# Patient Record
Sex: Female | Born: 2000 | Race: Black or African American | Hispanic: No | Marital: Single | Smoking: Never smoker
Health system: Southern US, Community
[De-identification: ages and names within clinical notes are randomized; demographics above are authoritative.]

---
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
## 2018-11-19 ENCOUNTER — Encounter (HOSPITAL_BASED_OUTPATIENT_CLINIC_OR_DEPARTMENT_OTHER): Payer: Self-pay | Admitting: Emergency Medicine

## 2018-11-19 ENCOUNTER — Emergency Department (HOSPITAL_BASED_OUTPATIENT_CLINIC_OR_DEPARTMENT_OTHER): Payer: Medicaid Other

## 2018-11-19 ENCOUNTER — Other Ambulatory Visit: Payer: Self-pay

## 2018-11-19 ENCOUNTER — Emergency Department (HOSPITAL_BASED_OUTPATIENT_CLINIC_OR_DEPARTMENT_OTHER)
Admission: EM | Admit: 2018-11-19 | Discharge: 2018-11-19 | Disposition: A | Discharge status: Home / Self Care | Payer: Medicaid Other | Attending: Emergency Medicine | Admitting: Emergency Medicine

## 2018-11-19 DIAGNOSIS — R9389 Abnormal findings on diagnostic imaging of other specified body structures: Secondary | ICD-10-CM | POA: Insufficient documentation

## 2018-11-19 DIAGNOSIS — N939 Abnormal uterine and vaginal bleeding, unspecified: Secondary | ICD-10-CM | POA: Diagnosis not present

## 2018-11-19 DIAGNOSIS — N912 Amenorrhea, unspecified: Secondary | ICD-10-CM | POA: Diagnosis not present

## 2018-11-19 DIAGNOSIS — R102 Pelvic and perineal pain: Secondary | ICD-10-CM

## 2018-11-19 LAB — CBC WITH DIFFERENTIAL/PLATELET
ABS IMMATURE GRANULOCYTES: 0.01 10*3/uL (ref 0.00–0.07)
Basophils Absolute: 0 10*3/uL (ref 0.0–0.1)
Basophils Relative: 1 %
Eosinophils Absolute: 0.3 10*3/uL (ref 0.0–0.5)
Eosinophils Relative: 5 %
HEMATOCRIT: 39.9 % (ref 36.0–46.0)
HEMOGLOBIN: 11.9 g/dL — AB (ref 12.0–15.0)
Immature Granulocytes: 0 %
LYMPHS ABS: 2.2 10*3/uL (ref 0.7–4.0)
Lymphocytes Relative: 39 %
MCH: 23.8 pg — ABNORMAL LOW (ref 26.0–34.0)
MCHC: 29.8 g/dL — ABNORMAL LOW (ref 30.0–36.0)
MCV: 80 fL (ref 80.0–100.0)
Monocytes Absolute: 0.5 10*3/uL (ref 0.1–1.0)
Monocytes Relative: 10 %
Neutro Abs: 2.6 10*3/uL (ref 1.7–7.7)
Neutrophils Relative %: 45 %
Platelets: 256 10*3/uL (ref 150–400)
RBC: 4.99 MIL/uL (ref 3.87–5.11)
RDW: 14.6 % (ref 11.5–15.5)
WBC: 5.6 10*3/uL (ref 4.0–10.5)
nRBC: 0 % (ref 0.0–0.2)

## 2018-11-19 LAB — URINALYSIS, ROUTINE W REFLEX MICROSCOPIC
Bilirubin Urine: NEGATIVE
Glucose, UA: NEGATIVE mg/dL
Hgb urine dipstick: NEGATIVE
KETONES UR: NEGATIVE mg/dL
Leukocytes,Ua: NEGATIVE
Nitrite: NEGATIVE
Protein, ur: NEGATIVE mg/dL
Specific Gravity, Urine: 1.025 (ref 1.005–1.030)
pH: 7 (ref 5.0–8.0)

## 2018-11-19 LAB — BASIC METABOLIC PANEL
Anion gap: 7 (ref 5–15)
BUN: 14 mg/dL (ref 6–20)
CO2: 24 mmol/L (ref 22–32)
Calcium: 8.8 mg/dL — ABNORMAL LOW (ref 8.9–10.3)
Chloride: 105 mmol/L (ref 98–111)
Creatinine, Ser: 0.89 mg/dL (ref 0.44–1.00)
GFR calc Af Amer: >60 mL/min (ref >60)
GFR calc non Af Amer: >60 mL/min (ref >60)
Glucose, Bld: 95 mg/dL (ref 70–99)
Potassium: 3.5 mmol/L (ref 3.5–5.1)
Sodium: 136 mmol/L (ref 135–145)

## 2018-11-19 LAB — WET PREP, GENITAL
Sperm: NONE SEEN
Trich, Wet Prep: NONE SEEN
Yeast Wet Prep HPF POC: NONE SEEN

## 2018-11-19 LAB — PREGNANCY, URINE: Preg Test, Ur: NEGATIVE

## 2018-11-19 NOTE — ED Triage Notes (Signed)
Vaginal bleeding x 10 days. Concerned about being pregnant. Denies pain.

## 2018-11-19 NOTE — Discharge Instructions (Addendum)
Follow-up with women's outpatient clinic in the next few weeks, sooner if your bleeding worsens, you develop severe abdominal pain, high fever, or other new and concerning symptoms.

## 2018-11-19 NOTE — ED Notes (Signed)
ED Provider at bedside. 

## 2018-11-19 NOTE — ED Notes (Signed)
Patient left at this time with all belongings. 

## 2018-11-19 NOTE — ED Provider Notes (Signed)
[facility] EMERGENCY DEPARTMENT Provider Note   CSN: [id number] Arrival date & time: 11/19/18  1843    History   Chief Complaint Chief Complaint  Patient presents with  . Vaginal Bleeding    HPI Erica Randall is a 18 y.o. female.     Patient is a 18 year old female presenting with complaints of vaginal bleeding.  She states that she has had her menstrual period continuously for the past 10 days.  She does describe some discomfort with intercourse and foul-smelling discharge.  She denies fevers or chills.  She denies any urinary complaints.  The history is provided by the patient.  Vaginal Bleeding  Quality:  Dark red and typical of menses Severity:  Moderate Onset quality:  Sudden Duration:  10 days Timing:  Constant Progression:  Unchanged Chronicity:  New   History reviewed. No pertinent past medical history.  There are no active problems to display for this patient.   History reviewed. No pertinent surgical history.   OB History   No obstetric history on file.      Home Medications    Prior to Admission medications   Not on File    Family History No family history on file.  Social History Social History   Tobacco Use  . Smoking status: Never Smoker  . Smokeless tobacco: Never Used  Substance Use Topics  . Alcohol use: Yes  . Drug use: Yes    Types: Marijuana     Allergies   Patient has no known allergies.   Review of Systems Review of Systems  Genitourinary: Positive for vaginal bleeding.  All other systems reviewed and are negative.    Physical Exam Updated Vital Signs BP 133/75 (BP Location: Left Arm)   Pulse (!) 114   Temp 98.2 F (36.8 C) (Oral)   Resp 18   Ht 5\' 9"  (1.753 m)   Wt 116.6 kg   LMP 08/05/2018 (Approximate)   SpO2 100%   BMI 37.95 kg/m   Physical Exam Vitals signs and nursing note reviewed.  Constitutional:      General: She is not in acute distress.    Appearance: She is well-developed. She  is not diaphoretic.  HENT:     Head: Normocephalic and atraumatic.  Neck:     Musculoskeletal: Normal range of motion and neck supple.  Cardiovascular:     Rate and Rhythm: Normal rate and regular rhythm.     Heart sounds: No murmur. No friction rub. No gallop.   Pulmonary:     Effort: Pulmonary effort is normal. No respiratory distress.     Breath sounds: Normal breath sounds. No wheezing.  Abdominal:     General: Bowel sounds are normal. There is no distension.     Palpations: Abdomen is soft.     Tenderness: There is no abdominal tenderness.  Genitourinary:    General: Normal vulva.     Vagina: Vaginal discharge present.     Comments: The external genitalia appears normal.  There is no active bleeding, just some dark red blood in the vaginal vault.  There is no adnexal mass or tenderness.  There is no cervical motion tenderness. Musculoskeletal: Normal range of motion.  Skin:    General: Skin is warm and dry.  Neurological:     Mental Status: She is alert and oriented to person, place, and time.      ED Treatments / Results  Labs (all labs ordered are listed, but only abnormal results are displayed) Labs Reviewed  WET PREP, GENITAL  PREGNANCY, URINE  BASIC METABOLIC PANEL  CBC WITH DIFFERENTIAL/PLATELET  GC/CHLAMYDIA PROBE AMP ([place]) NOT AT [facility]    EKG None  Radiology No results found.  Procedures Procedures (including critical care time)  Medications Ordered in ED Medications - No data to display   Initial Impression / Assessment and Plan / ED Course  I have reviewed the triage vital signs and the nursing notes.  Pertinent labs & imaging results that were available during my care of the patient were reviewed by me and considered in my medical decision making (see chart for details).  Patient is a 18 year old female presenting with complaints of vaginal bleeding.  She is also concerned she may be pregnant.  Her pregnancy test today is negative and  laboratory studies are unremarkable.  Pelvic exam shows no obvious abnormalities.  She does have clue cells, but I doubt BV.  GC and chlamydia tests are pending at this time.  She will be notified if these become abnormal.  Ultrasound was obtained showing a thickened endometrial stripe, the clinical significance of which I am uncertain.  Patient has been advised of this finding and is to follow-up with GYN in the next few weeks.  She is to return as needed for any problems.  Final Clinical Impressions(s) / ED Diagnoses   Final diagnoses:  None    ED Discharge Orders    None       Geoffery Lyons, MD 11/19/18 2235

## 2018-11-19 NOTE — ED Notes (Signed)
Called lab to add-on urinalysis °

## 2018-11-21 LAB — GC/CHLAMYDIA PROBE AMP (~~LOC~~) NOT AT ARMC
Chlamydia: NEGATIVE
Neisseria Gonorrhea: NEGATIVE

## 2018-12-02 ENCOUNTER — Ambulatory Visit (INDEPENDENT_AMBULATORY_CARE_PROVIDER_SITE_OTHER): Payer: Medicaid Other | Admitting: Family Medicine

## 2018-12-02 ENCOUNTER — Encounter: Payer: Self-pay | Admitting: Family Medicine

## 2018-12-02 ENCOUNTER — Other Ambulatory Visit: Payer: Self-pay

## 2018-12-02 VITALS — BP 120/57 | HR 91 | Ht 69.0 in | Wt 255.0 lb

## 2018-12-02 DIAGNOSIS — N939 Abnormal uterine and vaginal bleeding, unspecified: Secondary | ICD-10-CM

## 2018-12-02 DIAGNOSIS — B9689 Other specified bacterial agents as the cause of diseases classified elsewhere: Secondary | ICD-10-CM

## 2018-12-02 DIAGNOSIS — N760 Acute vaginitis: Secondary | ICD-10-CM

## 2018-12-02 MED ORDER — METRONIDAZOLE 500 MG PO TABS: 500.0000 mg | ORAL_TABLET | Freq: Two times a day (BID) | ORAL | 0 refills | Status: DC

## 2018-12-02 MED ORDER — NORGESTIMATE-ETH ESTRADIOL 0.25-35 MG-MCG PO TABS: 1.0000 | ORAL_TABLET | Freq: Every day | ORAL | 11 refills | Status: DC

## 2018-12-02 MED ORDER — NORGESTIMATE-ETH ESTRADIOL 0.25-35 MG-MCG PO TABS: 1.0000 | ORAL_TABLET | Freq: Every day | ORAL | 3 refills | Status: DC

## 2018-12-02 NOTE — Progress Notes (Signed)
Pt states that she is having abnormal bleeding x 3 weeks.

## 2018-12-02 NOTE — Progress Notes (Signed)
   Subjective:    Patient ID: Erica Randall, female    DOB: [birth year], 18 y.o.   MRN: [id number]  HPI Patient seen for abnormal uterine bleeding for the past 3 weeks.  Patient normally has irregular periods and may go a couple months without having a period.  Her blast bleeding started the end of February and has continued until today.  She was seen in the emergency department and an ultrasound done, which was normal (endometrial thickness 14 mm).  Additionally, she had clue cells on wet prep, but was not prescribed Flagyl.  Has foul vaginal discharge  No history of migraines.  No personal history of blood clots.  Denies tobacco smoking.  I have reviewed the patients past medical, family, and social history.  I have reviewed the patient's medication list and allergies.   Review of Systems     Objective:   Physical Exam Vitals signs reviewed.  Constitutional:      Appearance: Normal appearance.  HENT:     Head: Normocephalic and atraumatic.     Nose: Nose normal.     Mouth/Throat:     Mouth: Mucous membranes are moist.  Eyes:     Pupils: Pupils are equal, round, and reactive to light.  Neck:     Musculoskeletal: Normal range of motion.  Cardiovascular:     Rate and Rhythm: Normal rate and regular rhythm.     Pulses: Normal pulses.     Heart sounds: No murmur. No friction rub. No gallop.   Pulmonary:     Effort: Pulmonary effort is normal. No respiratory distress.     Breath sounds: Normal breath sounds. No stridor. No wheezing, rhonchi or rales.  Chest:     Chest wall: No tenderness.  Abdominal:     General: Abdomen is flat. There is no distension.     Palpations: Abdomen is soft.     Tenderness: There is no abdominal tenderness.  Skin:    General: Skin is warm.     Capillary Refill: Capillary refill takes less than 2 seconds.  Neurological:     General: No focal deficit present.     Mental Status: She is alert.        Assessment & Plan:  1. Abnormal uterine  bleeding (AUB) Was prescribed OCP taper.  Discussed how to use.  Discussed adverse side effects.  Discussed that birth control pills cannot protect against STDs. F/u in 2 months  2. BV (bacterial vaginosis) Flagyl prescribed

## 2018-12-25 MED ORDER — MEGESTROL ACETATE 40 MG PO TABS: 40.0000 mg | ORAL_TABLET | Freq: Every day | ORAL | 5 refills | Status: DC

## 2019-01-23 ENCOUNTER — Other Ambulatory Visit: Payer: Self-pay

## 2019-01-23 ENCOUNTER — Ambulatory Visit (INDEPENDENT_AMBULATORY_CARE_PROVIDER_SITE_OTHER): Payer: Medicaid Other | Admitting: Family Medicine

## 2019-01-23 VITALS — BP 120/65 | HR 90 | Wt 254.0 lb

## 2019-01-23 DIAGNOSIS — Z30013 Encounter for initial prescription of injectable contraceptive: Secondary | ICD-10-CM

## 2019-01-23 DIAGNOSIS — N939 Abnormal uterine and vaginal bleeding, unspecified: Secondary | ICD-10-CM | POA: Diagnosis not present

## 2019-01-23 MED ORDER — MEDROXYPROGESTERONE ACETATE 150 MG/ML IM SUSP: 150.0000 mg | INTRAMUSCULAR | 0 refills | Status: DC

## 2019-01-23 MED ORDER — MEGESTROL ACETATE 40 MG PO TABS: 40.0000 mg | ORAL_TABLET | Freq: Every day | ORAL | 5 refills | Status: DC

## 2019-01-23 MED ORDER — MEDROXYPROGESTERONE ACETATE 150 MG/ML IM SUSP
150.0000 mg | Freq: Once | INTRAMUSCULAR | Status: AC
  Administered 2019-01-23: 150 mg via INTRAMUSCULAR

## 2019-01-23 NOTE — Progress Notes (Signed)
   Subjective:    Patient ID: Erica Randall, female    DOB: Jul 22, 2001, 18 y.o.   MRN: [id number]  HPI Patient seen for follow up of AUB. Is having some breakthrough bleeding during the third week of pill pack. Is currently on 4th week. Had improved control last month with the addition of megace for 10 days.  She would like to switch to depo shots.   Review of Systems     Objective:   Physical Exam Constitutional:      Appearance: Normal appearance.  Cardiovascular:     Rate and Rhythm: Normal rate.     Pulses: Normal pulses.     Heart sounds: Normal heart sounds.  Pulmonary:     Effort: Pulmonary effort is normal.  Neurological:     Mental Status: She is alert.       Assessment & Plan:  1. Abnormal uterine bleeding (AUB) Change to depo. Will send prescription for megace to pharmacy in case she has increased breakthrough bleeding - patient to notify me if starts. Discussed possible weight gain with depo.  2. Encounter for initial prescription of injectable contraceptive  - medroxyPROGESTERone (DEPO-PROVERA) 150 MG/ML injection; Inject 1 mL (150 mg total) into the muscle every 3 (three) months.  Dispense: 1 mL; Refill: 0 - medroxyPROGESTERone (DEPO-PROVERA) injection 150 mg

## 2019-01-23 NOTE — Progress Notes (Signed)
Follow up bleeding and patient would like switch in birth control. Patient did take Megace for ten days when prescribed. Armandina Stammer RN

## 2019-01-28 ENCOUNTER — Other Ambulatory Visit: Payer: Self-pay | Admitting: Family Medicine

## 2019-01-28 DIAGNOSIS — Z30013 Encounter for initial prescription of injectable contraceptive: Secondary | ICD-10-CM

## 2019-01-29 MED ORDER — MEGESTROL ACETATE 40 MG PO TABS: 40.0000 mg | ORAL_TABLET | Freq: Two times a day (BID) | ORAL | 0 refills | Status: DC

## 2019-02-04 MED ORDER — MEGESTROL ACETATE 40 MG PO TABS: 80.0000 mg | ORAL_TABLET | Freq: Two times a day (BID) | ORAL | 2 refills | Status: DC

## 2019-02-09 DIAGNOSIS — N939 Abnormal uterine and vaginal bleeding, unspecified: Secondary | ICD-10-CM

## 2019-02-14 ENCOUNTER — Ambulatory Visit (HOSPITAL_BASED_OUTPATIENT_CLINIC_OR_DEPARTMENT_OTHER)
Admission: RE | Admit: 2019-02-14 | Discharge: 2019-02-14 | Disposition: A | Discharge status: Home / Self Care | Payer: Medicaid Other | Source: Ambulatory Visit | Attending: Family Medicine | Admitting: Family Medicine

## 2019-02-14 ENCOUNTER — Other Ambulatory Visit: Payer: Self-pay

## 2019-02-14 ENCOUNTER — Encounter (HOSPITAL_BASED_OUTPATIENT_CLINIC_OR_DEPARTMENT_OTHER): Payer: Self-pay

## 2019-02-14 DIAGNOSIS — N939 Abnormal uterine and vaginal bleeding, unspecified: Secondary | ICD-10-CM | POA: Diagnosis not present

## 2019-02-19 MED ORDER — CITALOPRAM HYDROBROMIDE 20 MG PO TABS: 20.0000 mg | ORAL_TABLET | Freq: Every day | ORAL | 12 refills | Status: DC

## 2019-02-19 MED ORDER — NORGESTIMATE-ETH ESTRADIOL 0.25-35 MG-MCG PO TABS: 1.0000 | ORAL_TABLET | Freq: Every day | ORAL | 3 refills | Status: DC

## 2019-02-19 NOTE — Telephone Encounter (Signed)
Called patient. Patient quiet emotional on phone, sobbing and crying. Patient having a lot of mood swings, crying and depression. Patient is still having some spotting and occasional days of increased bleeding. Had less bleeding when she was on OCP and had less bleeding.   Discussed need to take OCP every day. She also would like to start Antidepressent - started celexa 20mg . I discussed with her that mood improvement may be seen with OCP, but may be delayed with celexa for a couple of weeks, but that she may see improvement with anxiety component in a 3-5 days.  Patient instructed to call with any worsening of mood or bleeding.  Levie Heritage, DO 02/19/2019 10:05 AM

## 2019-03-13 ENCOUNTER — Other Ambulatory Visit: Payer: Self-pay | Admitting: Family Medicine

## 2019-03-27 ENCOUNTER — Other Ambulatory Visit (HOSPITAL_COMMUNITY)
Admission: RE | Admit: 2019-03-27 | Discharge: 2019-03-27 | Disposition: A | Discharge status: Home / Self Care | Payer: Medicaid Other | Source: Ambulatory Visit | Attending: Family Medicine | Admitting: Family Medicine

## 2019-03-27 ENCOUNTER — Other Ambulatory Visit: Payer: Self-pay

## 2019-03-27 ENCOUNTER — Other Ambulatory Visit (INDEPENDENT_AMBULATORY_CARE_PROVIDER_SITE_OTHER): Payer: Medicaid Other

## 2019-03-27 VITALS — BP 126/94 | HR 102

## 2019-03-27 DIAGNOSIS — N898 Other specified noninflammatory disorders of vagina: Secondary | ICD-10-CM | POA: Diagnosis not present

## 2019-03-27 NOTE — Progress Notes (Signed)
SUBJECTIVE:  18 y.o. female complains of thick vaginal discharge for 1 week(s). Denies abnormal vaginal bleeding or significant pelvic pain or fever. No UTI symptoms. Denies history of known exposure to STD.  No LMP recorded.  OBJECTIVE:  She appears well, afebrile. Urine dipstick: not done.  ASSESSMENT:  Vaginal Discharge     PLAN:  GC, chlamydia, trichomonas, BVAG, CVAG probe sent to lab. Treatment: To be determined once lab results are received ROV prn if symptoms persist or worsen.

## 2019-03-27 NOTE — Progress Notes (Addendum)
Chart reviewed - agree with RN documentation.   

## 2019-03-28 LAB — CERVICOVAGINAL ANCILLARY ONLY
Bacterial vaginitis: NEGATIVE
Candida vaginitis: POSITIVE — AB
Chlamydia: NEGATIVE
Neisseria Gonorrhea: NEGATIVE
Trichomonas: NEGATIVE

## 2019-03-31 ENCOUNTER — Other Ambulatory Visit: Payer: Self-pay | Admitting: Family Medicine

## 2019-03-31 MED ORDER — FLUCONAZOLE 150 MG PO TABS: 150.0000 mg | ORAL_TABLET | Freq: Every day | ORAL | 2 refills | Status: DC

## 2019-04-10 ENCOUNTER — Ambulatory Visit: Payer: Medicaid Other

## 2019-04-15 ENCOUNTER — Other Ambulatory Visit: Payer: Self-pay | Admitting: Family Medicine

## 2019-04-15 DIAGNOSIS — Z30013 Encounter for initial prescription of injectable contraceptive: Secondary | ICD-10-CM

## 2019-05-29 ENCOUNTER — Other Ambulatory Visit: Payer: Self-pay

## 2019-05-29 DIAGNOSIS — Z20822 Contact with and (suspected) exposure to covid-19: Secondary | ICD-10-CM

## 2019-05-30 LAB — NOVEL CORONAVIRUS, NAA: SARS-CoV-2, NAA: NOT DETECTED

## 2019-07-26 IMAGING — US US PELVIS COMPLETE WITH TRANSVAGINAL
1 series · 13 of 25 positions shown · non-contrast
Comparison: None

CLINICAL DATA: Long cycle after 2-3 months without cycle

EXAM:
TRANSABDOMINAL AND TRANSVAGINAL ULTRASOUND OF PELVIS
TECHNIQUE: Both transabdominal and transvaginal ultrasound examinations of the
pelvis were performed. Transabdominal technique was performed for
global imaging of the pelvis including uterus, ovaries, adnexal
regions, and pelvic cul-de-sac. It was necessary to proceed with
endovaginal exam following the transabdominal exam to visualize the
uterus endometrium and ovaries.

[Series 1: us pelvis complete with transvaginal · 0.25mm/px · 13 of 43 slices shown]
[im 1/43]
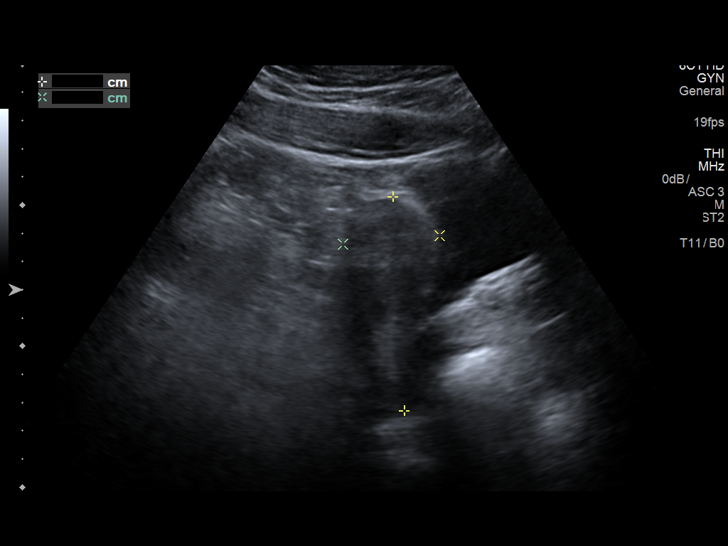
[im 4/43]
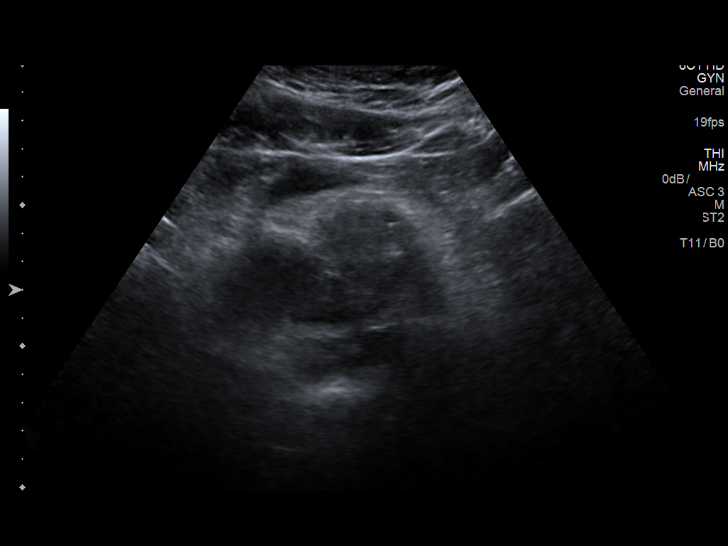
[im 8/43]
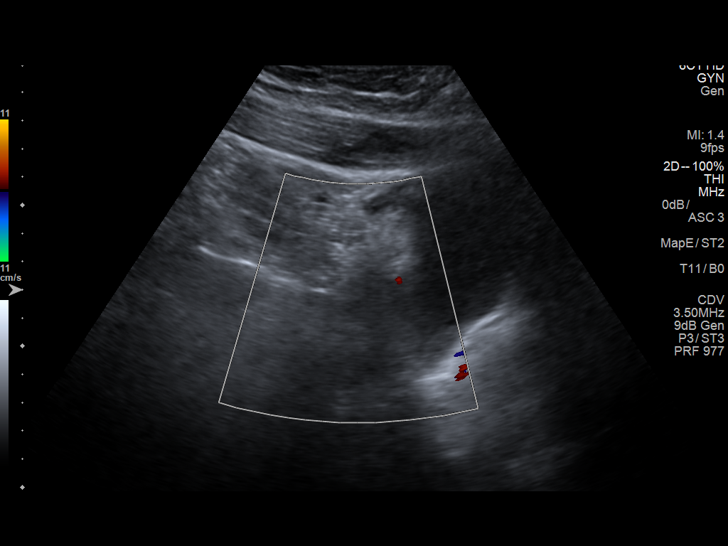
[im 11/43]
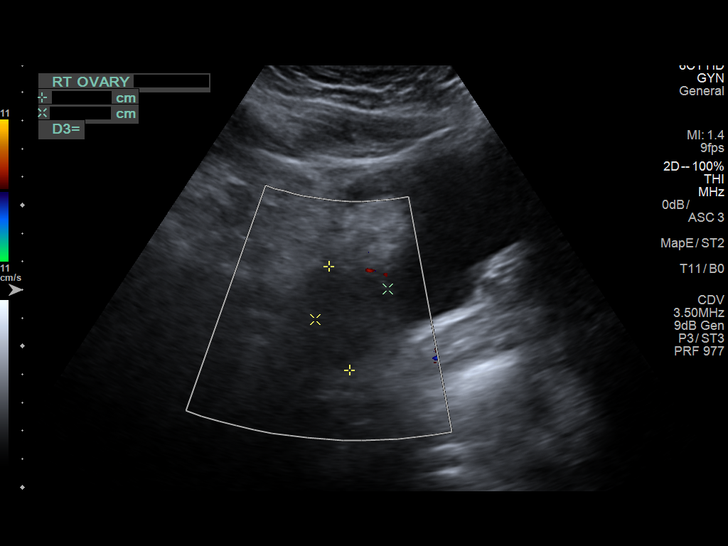
[im 15/43]
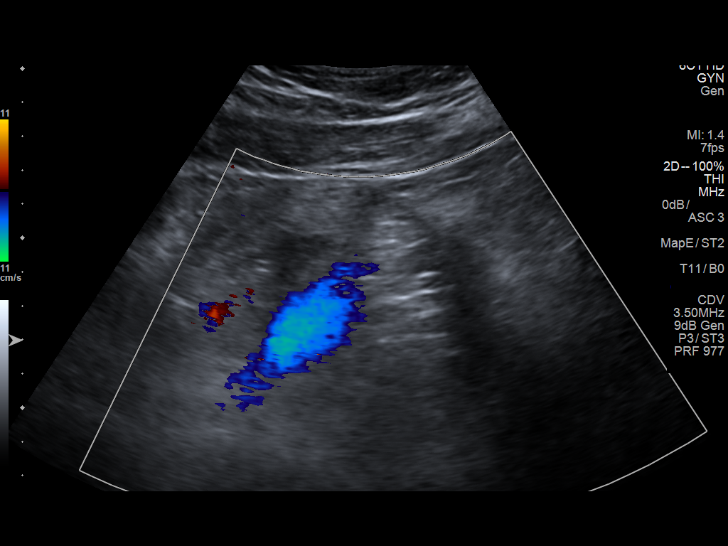
[im 18/43]
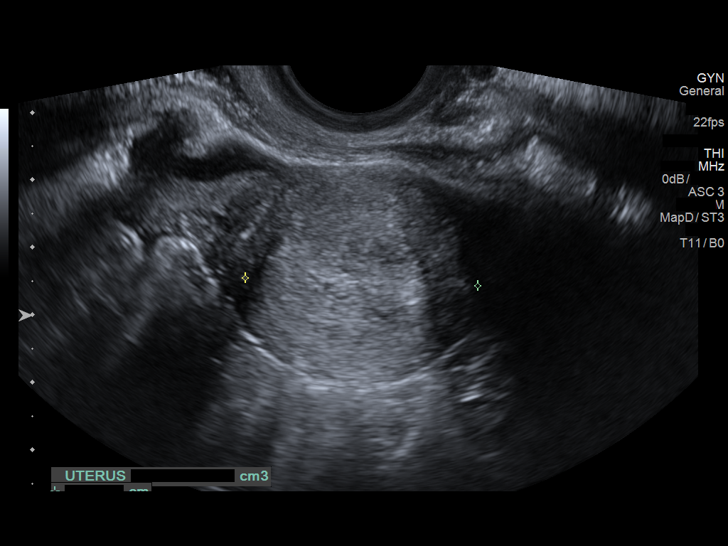
[im 22/43]
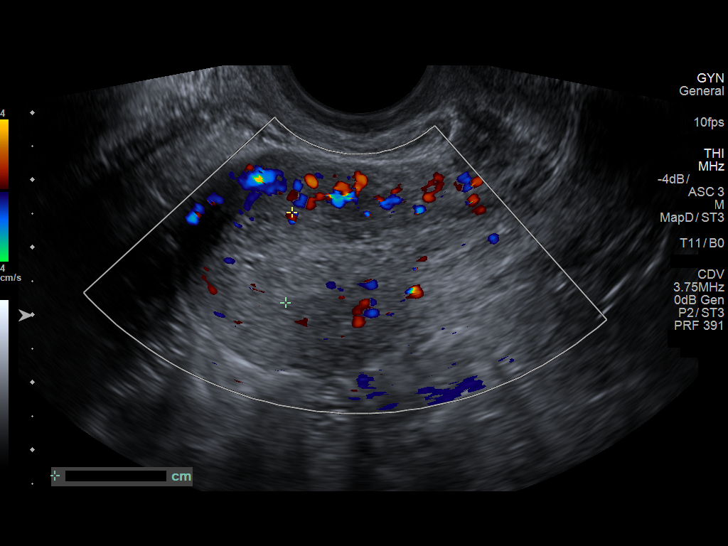
[im 25/43]
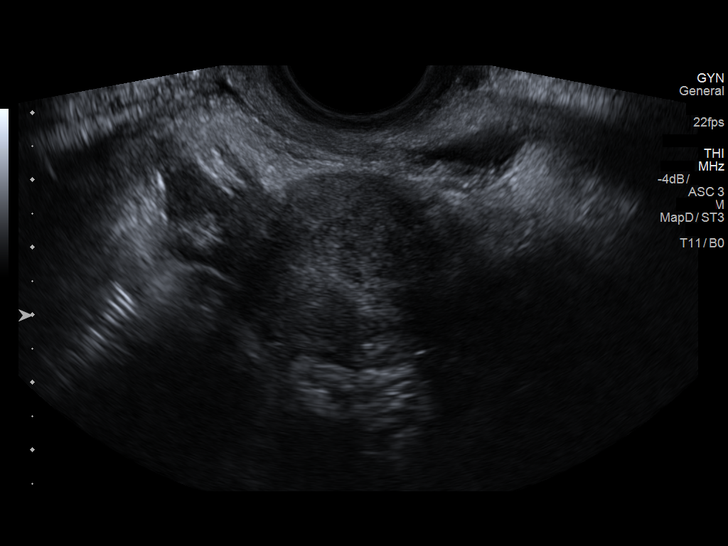
[im 29/43]
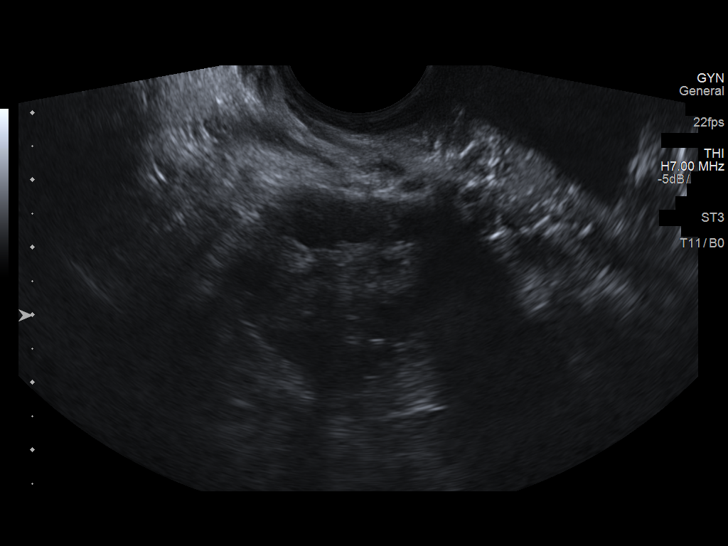
[im 32/43]
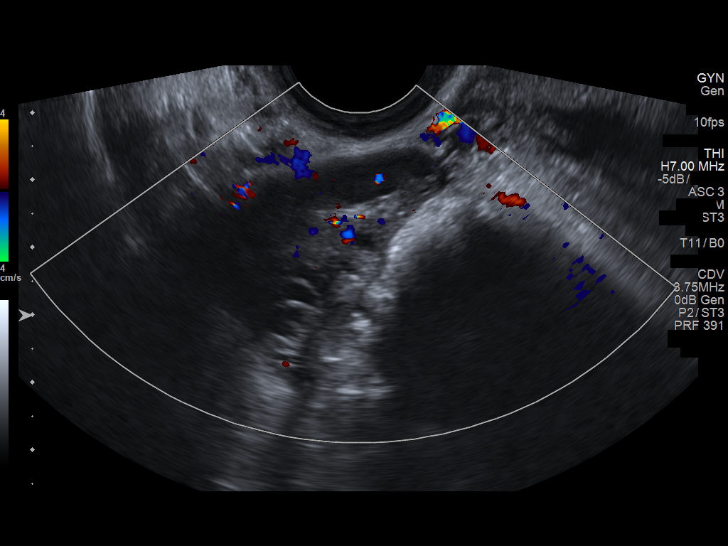
[im 36/43]
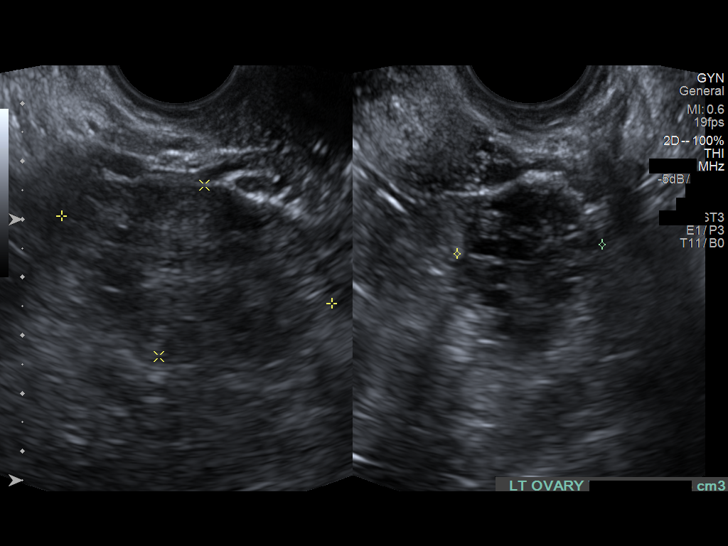
[im 39/43]
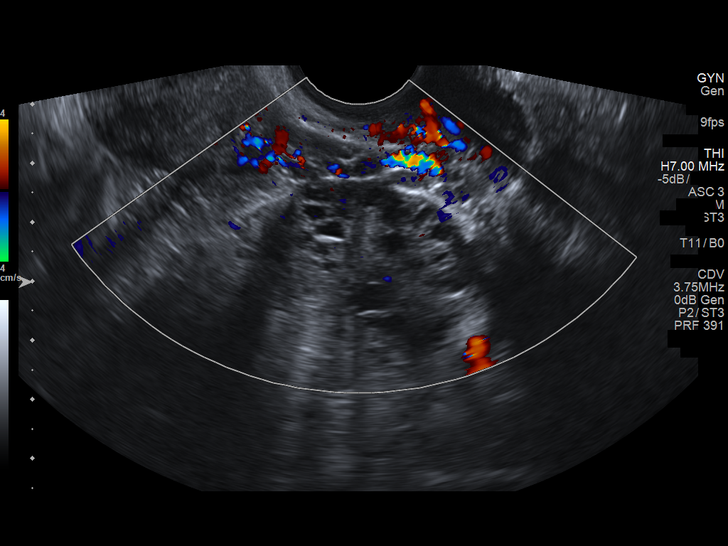
[im 43/43]
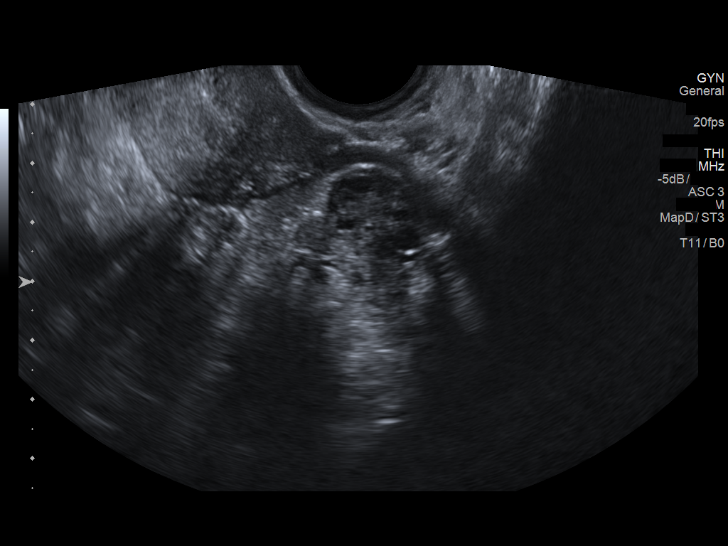

[13 of 25 positions shown; findings below may reference images not displayed]

FINDINGS: Uterus

Measurements: 7.5 x 3.6 x 3.5 cm = volume: 49 mL. No fibroids or
other mass visualized.

Endometrium

Thickness: 14 mm. No focal abnormality visualized. Poorly defined
endometrial/myometrial interface. Suggestion of tiny cystic spaces.

Right ovary

Measurements: 4 x 2.3 x 3.3 cm = volume: 16 mL. Normal appearance/no
adnexal mass.

Left ovary

Measurements: 4.9 x 3.1 x 2.5 cm = volume: 20 mL. Normal
appearance/no adnexal mass.

Other findings

Trace free fluid in the pelvis
IMPRESSION: 1. Endometrial thickness of 14 mm. If bleeding remains unresponsive
to hormonal or medical therapy, sonohysterogram should be considered
for focal lesion work-up. (Ref: Radiological Reasoning: Algorithmic
Workup of Abnormal Vaginal Bleeding with Endovaginal Sonography and
Sonohysterography. AJR 0009; 191:S68-73)
2. Somewhat poorly defined endometrial and myometrial interface,
could be seen in the setting of adenomyosis. Nonemergent pelvic MRI
could be obtained to further evaluate.
3. Trace free fluid in the pelvis

## 2019-09-19 DIAGNOSIS — E282 Polycystic ovarian syndrome: Secondary | ICD-10-CM

## 2019-09-19 HISTORY — DX: Polycystic ovarian syndrome: E28.2

## 2019-09-25 ENCOUNTER — Ambulatory Visit (INDEPENDENT_AMBULATORY_CARE_PROVIDER_SITE_OTHER): Payer: Medicaid Other

## 2019-09-25 ENCOUNTER — Other Ambulatory Visit: Payer: Self-pay

## 2019-09-25 ENCOUNTER — Other Ambulatory Visit (HOSPITAL_COMMUNITY)
Admission: RE | Admit: 2019-09-25 | Discharge: 2019-09-25 | Disposition: A | Discharge status: Home / Self Care | Payer: Medicaid Other | Source: Ambulatory Visit | Attending: Family Medicine | Admitting: Family Medicine

## 2019-09-25 VITALS — BP 118/55 | HR 107 | Ht 69.0 in | Wt 232.1 lb

## 2019-09-25 DIAGNOSIS — N898 Other specified noninflammatory disorders of vagina: Secondary | ICD-10-CM

## 2019-09-25 NOTE — Progress Notes (Signed)
Pt presents with vaginal discharge x 1 week. Pt requests STI screening. Self swab was sent to the lab.  Fermin Yan l Corinda Ammon, CMA

## 2019-09-25 NOTE — Progress Notes (Signed)
Chart reviewed - agree with CMA/RN documentation.  ° °

## 2019-09-29 LAB — CERVICOVAGINAL ANCILLARY ONLY
Bacterial Vaginitis (gardnerella): NEGATIVE
Candida Glabrata: NEGATIVE
Candida Vaginitis: NEGATIVE
Chlamydia: NEGATIVE
Comment: NEGATIVE
Comment: NEGATIVE
Comment: NEGATIVE
Comment: NEGATIVE
Comment: NEGATIVE
Comment: NORMAL
Neisseria Gonorrhea: NEGATIVE
Trichomonas: NEGATIVE

## 2019-10-20 ENCOUNTER — Other Ambulatory Visit (HOSPITAL_COMMUNITY)
Admission: RE | Admit: 2019-10-20 | Discharge: 2019-10-20 | Disposition: A | Discharge status: Home / Self Care | Payer: Medicaid Other | Source: Ambulatory Visit | Attending: Obstetrics & Gynecology | Admitting: Obstetrics & Gynecology

## 2019-10-20 ENCOUNTER — Ambulatory Visit (INDEPENDENT_AMBULATORY_CARE_PROVIDER_SITE_OTHER): Payer: Medicaid Other | Admitting: Obstetrics & Gynecology

## 2019-10-20 ENCOUNTER — Other Ambulatory Visit: Payer: Self-pay

## 2019-10-20 ENCOUNTER — Encounter: Payer: Self-pay | Admitting: Obstetrics & Gynecology

## 2019-10-20 VITALS — BP 108/51 | HR 96 | Ht 69.0 in | Wt 227.0 lb

## 2019-10-20 DIAGNOSIS — N898 Other specified noninflammatory disorders of vagina: Secondary | ICD-10-CM | POA: Insufficient documentation

## 2019-10-20 NOTE — Patient Instructions (Signed)
Vaginitis Vaginitis is a condition in which the vaginal tissue swells and becomes red (inflamed). This condition is most often caused by a change in the normal balance of bacteria and yeast that live in the vagina. This change causes an overgrowth of certain bacteria or yeast, which causes the inflammation. There are different types of vaginitis, but the most common types are:  Bacterial vaginosis.  Yeast infection (candidiasis).  Trichomoniasis vaginitis. This is a sexually transmitted disease (STD).  Viral vaginitis.  Atrophic vaginitis.  Allergic vaginitis. What are the causes? The cause of this condition depends on the type of vaginitis. It can be caused by:  Bacteria (bacterial vaginosis).  Yeast, which is a fungus (yeast infection).  A parasite (trichomoniasis vaginitis).  A virus (viral vaginitis).  Low hormone levels (atrophic vaginitis). Low hormone levels can occur during pregnancy, breastfeeding, or after menopause.  Irritants, such as bubble baths, scented tampons, and feminine sprays (allergic vaginitis). Other factors can change the normal balance of the yeast and bacteria that live in the vagina. These include:  Antibiotic medicines.  Poor hygiene.  Diaphragms, vaginal sponges, spermicides, birth control pills, and intrauterine devices (IUD).  Sex.  Infection.  Uncontrolled diabetes.  A weakened defense (immune) system. What increases the risk? This condition is more likely to develop in women who:  Smoke.  Use vaginal douches, scented tampons, or scented sanitary pads.  Wear tight-fitting pants.  Wear thong underwear.  Use oral birth control pills or an IUD.  Have sex without a condom.  Have multiple sex partners.  Have an STD.  Frequently use the spermicide nonoxynol-9.  Eat lots of foods high in sugar.  Have uncontrolled diabetes.  Have low estrogen levels.  Have a weakened immune system from an immune disorder or medical  treatment.  Are pregnant or breastfeeding. What are the signs or symptoms? Symptoms vary depending on the cause of the vaginitis. Common symptoms include:  Abnormal vaginal discharge. ? The discharge is white, gray, or yellow with bacterial vaginosis. ? The discharge is thick, white, and cheesy with a yeast infection. ? The discharge is frothy and yellow or greenish with trichomoniasis.  A bad vaginal smell. The smell is fishy with bacterial vaginosis.  Vaginal itching, pain, or swelling.  Sex that is painful.  Pain or burning when urinating. Sometimes there are no symptoms. How is this diagnosed? This condition is diagnosed based on your symptoms and medical history. A physical exam, including a pelvic exam, will also be done. You may also have other tests, including:  Tests to determine the pH level (acidity or alkalinity) of your vagina.  A whiff test, to assess the odor that results when a sample of your vaginal discharge is mixed with a potassium hydroxide solution.  Tests of vaginal fluid. A sample will be examined under a microscope. How is this treated? Treatment varies depending on the type of vaginitis you have. Your treatment may include:  Antibiotic creams or pills to treat bacterial vaginosis and trichomoniasis.  Antifungal medicines, such as vaginal creams or suppositories, to treat a yeast infection.  Medicine to ease discomfort if you have viral vaginitis. Your sexual partner should also be treated.  Estrogen delivered in a cream, pill, suppository, or vaginal ring to treat atrophic vaginitis. If vaginal dryness occurs, lubricants and moisturizing creams may help. You may need to avoid scented soaps, sprays, or douches.  Stopping use of a product that is causing allergic vaginitis. Then using a vaginal cream to treat the symptoms. Follow   these instructions at home: Lifestyle  Keep your genital area clean and dry. Avoid soap, and only rinse the area with  water.  Do not douche or use tampons until your health care provider says it is okay to do so. Use sanitary pads, if needed.  Do not have sex until your health care provider approves. When you can return to sex, practice safe sex and use condoms.  Wipe from front to back. This avoids the spread of bacteria from the rectum to the vagina. General instructions  Take over-the-counter and prescription medicines only as told by your health care provider.  If you were prescribed an antibiotic medicine, take or use it as told by your health care provider. Do not stop taking or using the antibiotic even if you start to feel better.  Keep all follow-up visits as told by your health care provider. This is important. How is this prevented?  Use mild, non-scented products. Do not use things that can irritate the vagina, such as fabric softeners. Avoid the following products if they are scented: ? Feminine sprays. ? Detergents. ? Tampons. ? Feminine hygiene products. ? Soaps or bubble baths.  Let air reach your genital area. ? Wear cotton underwear to reduce moisture buildup. ? Avoid wearing underwear while you sleep. ? Avoid wearing tight pants and underwear or nylons without a cotton panel. ? Avoid wearing thong underwear.  Take off any wet clothing, such as bathing suits, as soon as possible.  Practice safe sex and use condoms. Contact a health care provider if:  You have abdominal pain.  You have a fever.  You have symptoms that last for more than 2-3 days. Get help right away if:  You have a fever and your symptoms suddenly get worse. Summary  Vaginitis is a condition in which the vaginal tissue becomes inflamed.This condition is most often caused by a change in the normal balance of bacteria and yeast that live in the vagina.  Treatment varies depending on the type of vaginitis you have.  Do not douche, use tampons , or have sex until your health care provider approves. When  you can return to sex, practice safe sex and use condoms. This information is not intended to replace advice given to you by your health care provider. Make sure you discuss any questions you have with your health care provider. Document Revised: 08/17/2017 Document Reviewed: 10/10/2016 Elsevier Patient Education  2020 [employer]  

## 2019-10-20 NOTE — Progress Notes (Signed)
History:  19 y.o. G0P0000 here today for eval of vaginal discharge. Pt reports that a few weeks ago, she had irreg discharge and vaginal irritation. She took OTC anti-yeast meds after which she came to the ofc for eval and her STI and yeast tests were neg. She reports cont discharge and vaginal irritation and she just wants to make sure that all is well. She has take no further OTC meds.          The following portions of the patient's history were reviewed and updated as appropriate: allergies, current medications, past family history, past medical history, past social history, past surgical history and problem list.  Review of Systems:  Pertinent items are noted in HPI.    Objective:  Physical Exam Blood pressure (!) 108/51, pulse 96, height 5\' 9"  (1.753 m), weight 227 lb (103 kg), last menstrual period 09/13/2019.  CONSTITUTIONAL: Well-developed, well-nourished female in no acute distress.  HENT:  Normocephalic, atraumatic EYES: Conjunctivae and EOM are normal. No scleral icterus.  NECK: Normal range of motion SKIN: Skin is warm and dry. No rash noted. Not diaphoretic.No pallor. NEUROLGIC: Alert and oriented to person, place, and time. Normal coordination.  Abd: Soft, nontender and nondistended Pelvic: Normal appearing external genitalia; normal appearing vaginal mucosa and cervix.  Normal discharge.  No CMT or mucopurulent discharge.   Labs and Imaging Affirm and cx were neg on 09/25/2019  Assessment & Plan:  Vaginal discharge abnormal  Repeat Affirm and cx  F/u prn  Total face-to-face time with patient was 15 min.  Greater than 50% was spent in counseling and coordination of care with the patient.   Sofi Bryars L. Harraway-Smith, M.D., 11/23/2019

## 2019-10-23 LAB — CERVICOVAGINAL ANCILLARY ONLY
Bacterial Vaginitis (gardnerella): NEGATIVE
Candida Glabrata: NEGATIVE
Candida Vaginitis: NEGATIVE
Chlamydia: NEGATIVE
Comment: NEGATIVE
Comment: NEGATIVE
Comment: NEGATIVE
Comment: NEGATIVE
Comment: NEGATIVE
Comment: NORMAL
Neisseria Gonorrhea: NEGATIVE
Trichomonas: NEGATIVE

## 2020-06-10 DIAGNOSIS — N926 Irregular menstruation, unspecified: Secondary | ICD-10-CM | POA: Diagnosis not present

## 2020-07-07 IMAGING — US TRANSVAGINAL ULTRASOUND OF PELVIS
1 series · 14 of 25 positions shown · non-contrast
Comparison: 11/19/2018

CLINICAL DATA: Abnormal uterine bleeding.

EXAM:
ULTRASOUND PELVIS TRANSVAGINAL
TECHNIQUE: Transvaginal ultrasound examination of the pelvis was performed
including evaluation of the uterus, ovaries, adnexal regions, and
pelvic cul-de-sac.

[Series 1: transvaginal ultrasound of pelvis · 14 of 95 slices shown]
[im 1/95]
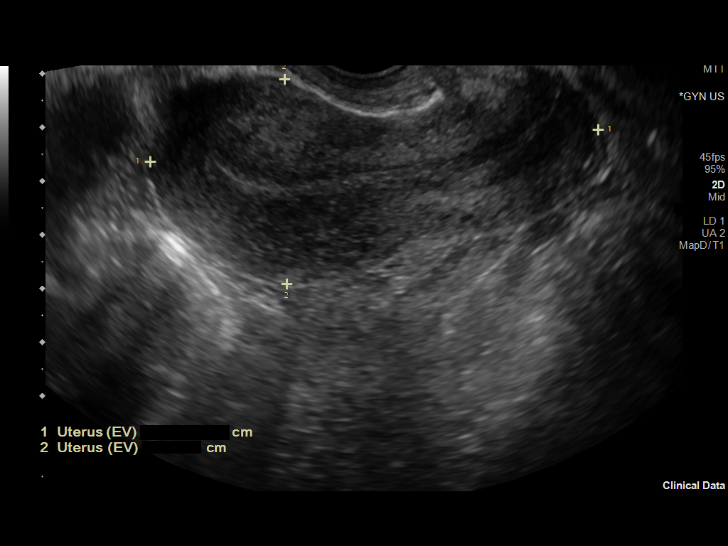
[im 8/95]
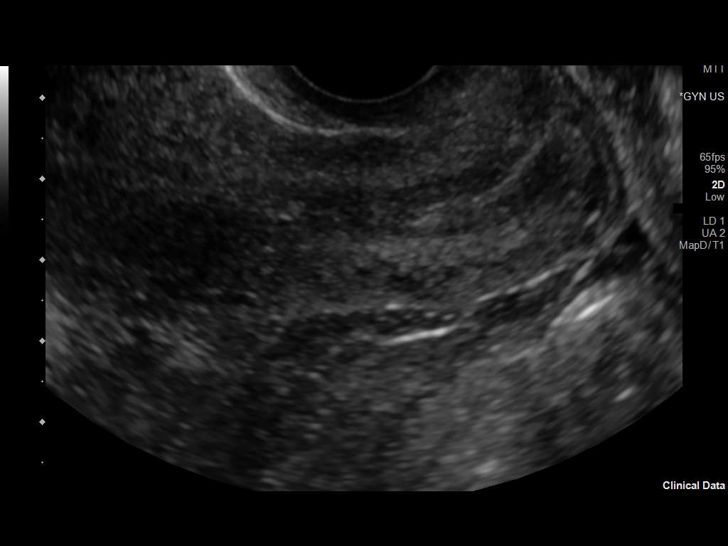
[im 16/95]
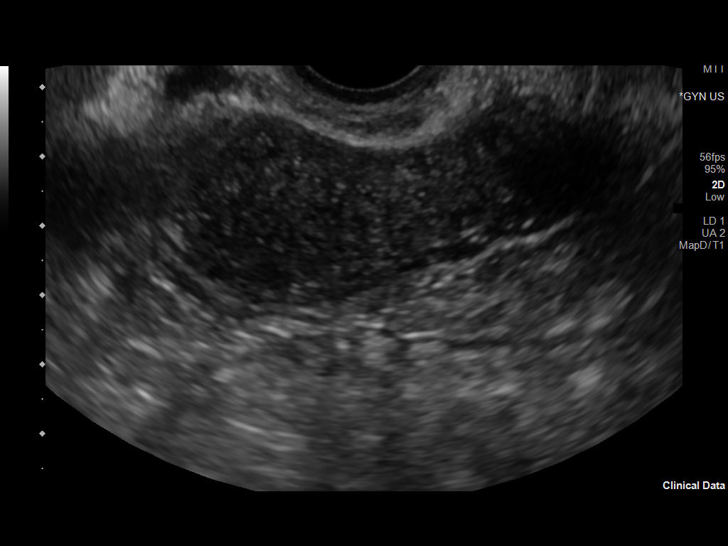
[im 24/95]
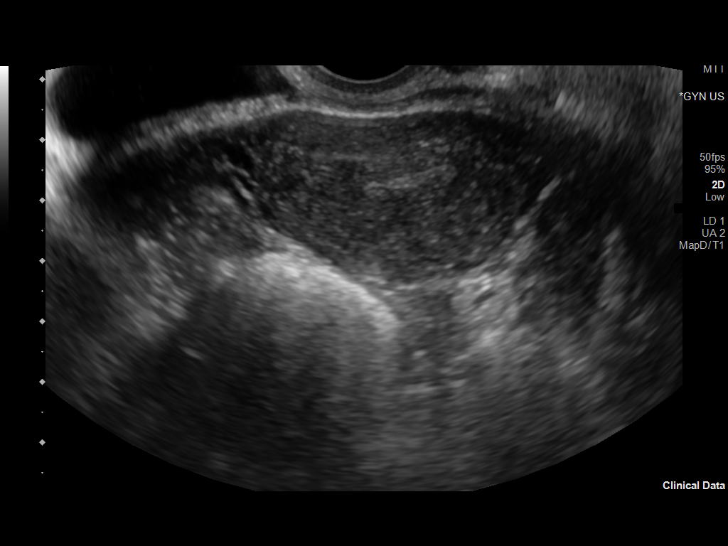
[im 32/95]
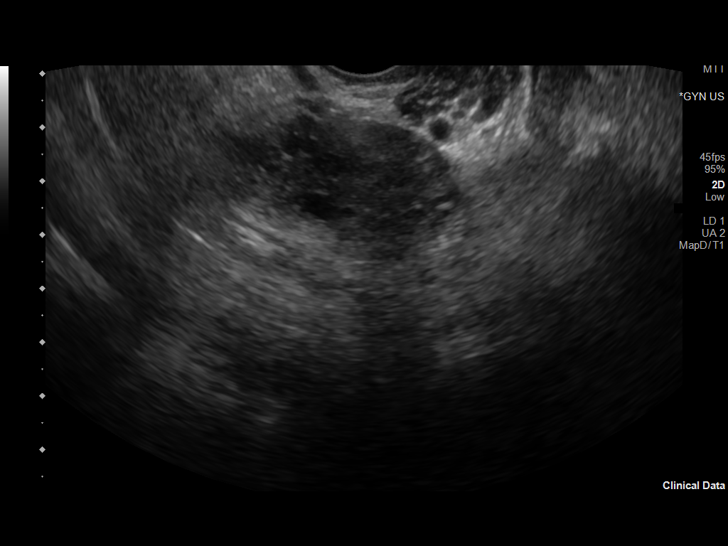
[im 36/95]
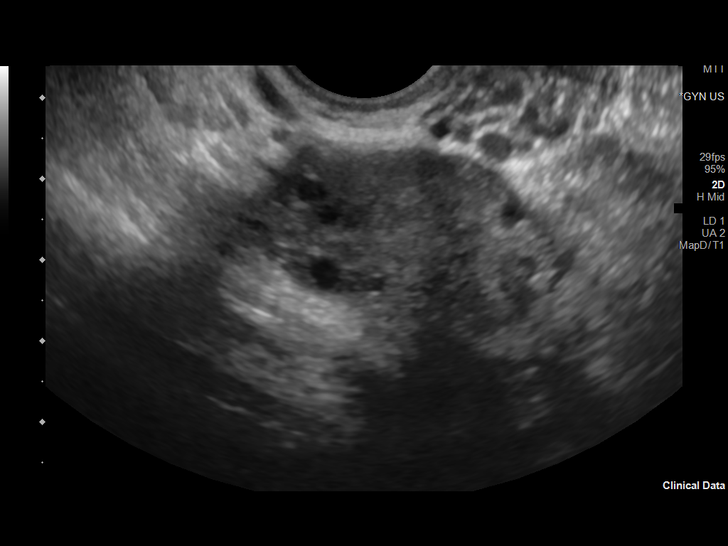
[im 44/95]
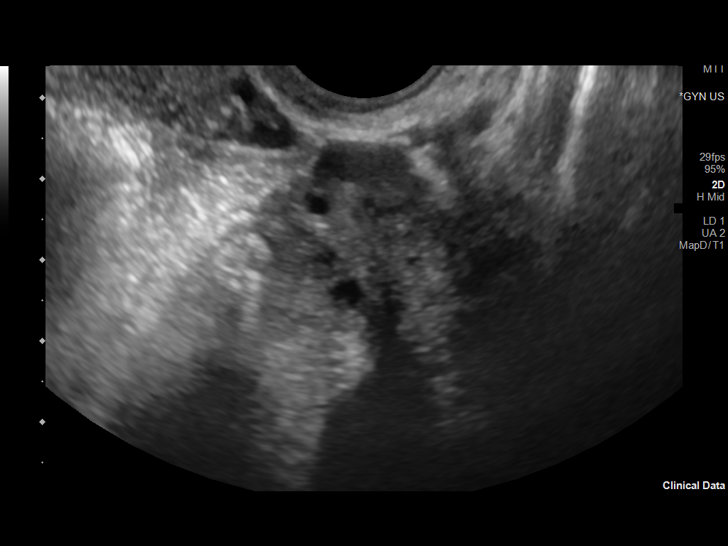
[im 51/95]
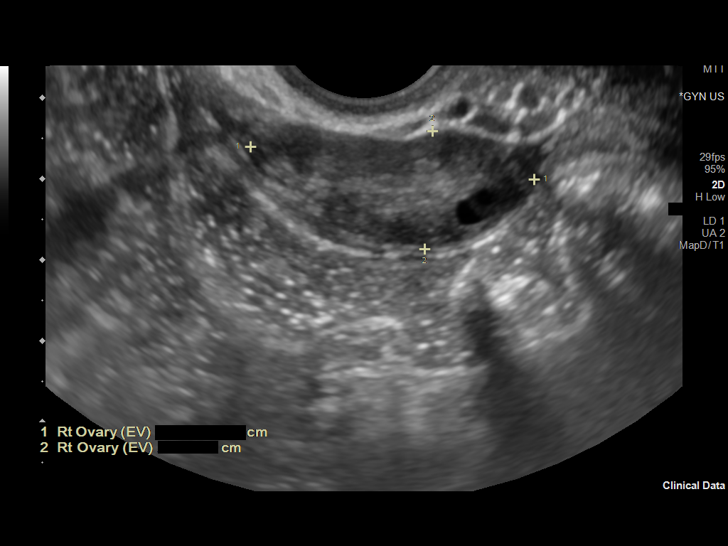
[im 59/95]
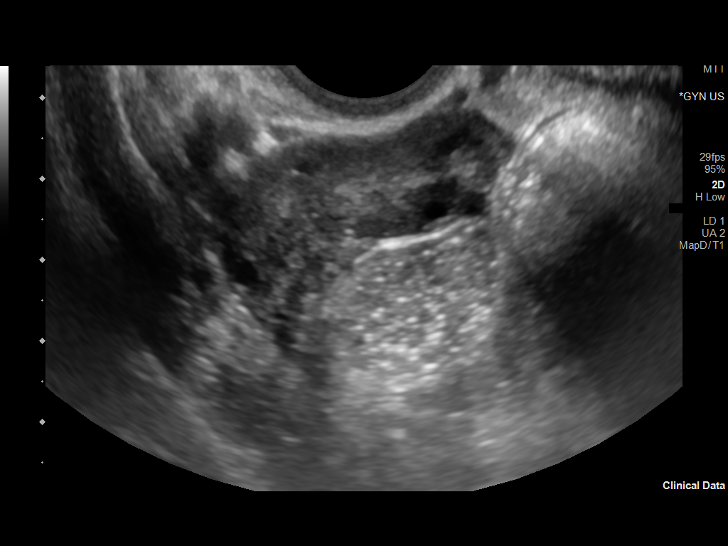
[im 63/95]
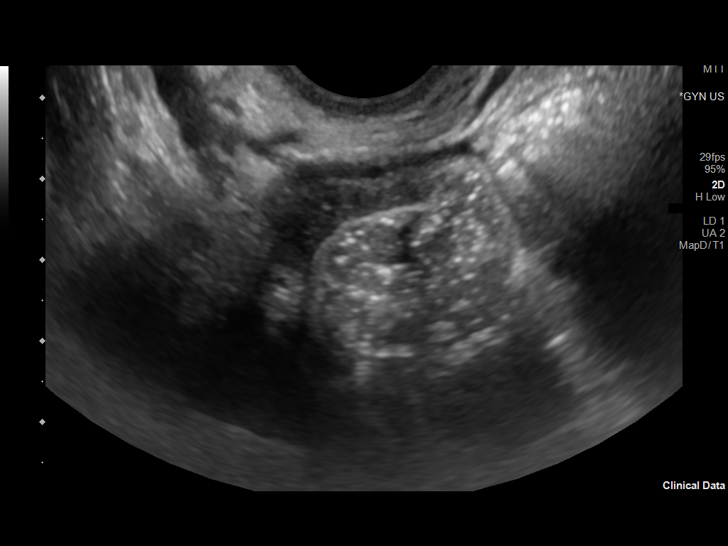
[im 71/95]
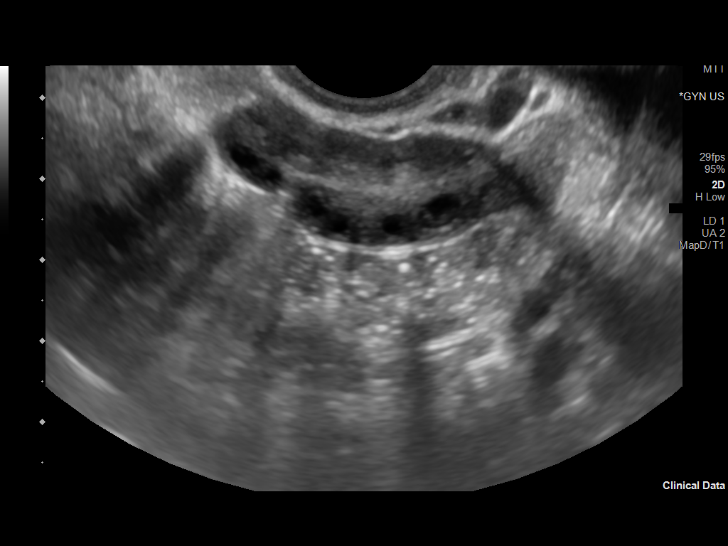
[im 79/95]
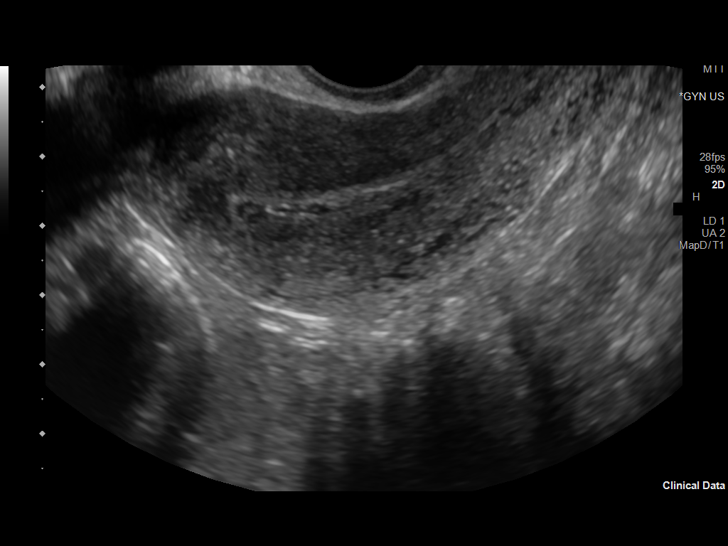
[im 87/95]
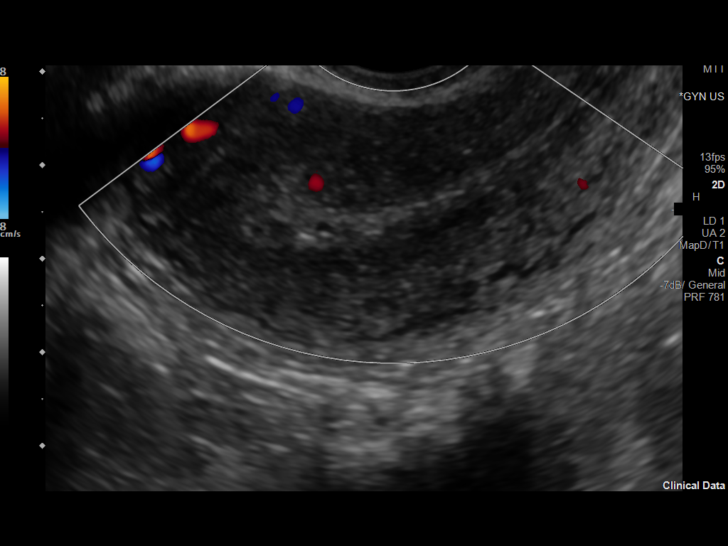
[im 95/95]
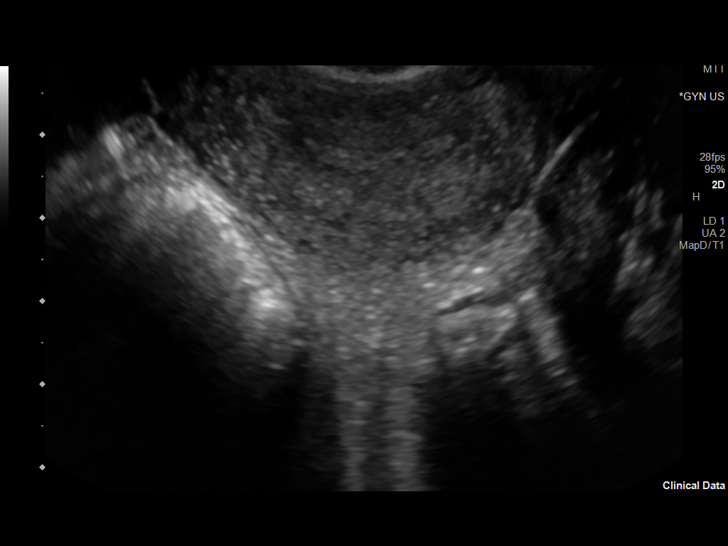

[14 of 25 positions shown; findings below may reference images not displayed]

FINDINGS: Uterus

Measurements: 8.4 x 3.8 x 5.0 cm = volume: 84 mL. No fibroids or
other mass visualized.

Endometrium

Thickness: 6 mm. Minimal fluid noted in endometrial cavity.
Otherwise unremarkable.

Right ovary

Measurements: 4.1 x 1.6 x 3.8 cm = volume: 12.7 mL. Normal
appearance/no adnexal mass.

Left ovary

Measurements: 4.1 x 2.4 x 2.1 cm = volume: 10.7 mL. Normal
appearance/no adnexal mass.

Other findings:  No abnormal free fluid
IMPRESSION: Endometrial thickness measures 6 mm. No pelvic mass or other
significant abnormality identified.

## 2020-09-15 ENCOUNTER — Ambulatory Visit (HOSPITAL_COMMUNITY)
Admission: EM | Admit: 2020-09-15 | Discharge: 2020-09-15 | Discharge status: Against Medical Advice | Payer: Medicaid Other

## 2020-09-15 ENCOUNTER — Other Ambulatory Visit: Payer: Self-pay

## 2020-09-15 NOTE — ED Notes (Signed)
Pt called in lobby, no response 

## 2020-10-28 ENCOUNTER — Ambulatory Visit: Payer: Self-pay | Admitting: Nurse Practitioner

## 2020-11-22 ENCOUNTER — Encounter: Payer: Self-pay | Admitting: Obstetrics & Gynecology

## 2020-11-22 ENCOUNTER — Ambulatory Visit (INDEPENDENT_AMBULATORY_CARE_PROVIDER_SITE_OTHER): Payer: Medicaid Other | Admitting: Obstetrics & Gynecology

## 2020-11-22 ENCOUNTER — Other Ambulatory Visit (HOSPITAL_COMMUNITY)
Admission: RE | Admit: 2020-11-22 | Discharge: 2020-11-22 | Disposition: A | Discharge status: Home / Self Care | Payer: Medicaid Other | Source: Ambulatory Visit | Attending: Obstetrics & Gynecology | Admitting: Obstetrics & Gynecology

## 2020-11-22 VITALS — BP 117/63 | HR 77 | Ht 69.0 in | Wt 225.0 lb

## 2020-11-22 DIAGNOSIS — Z3009 Encounter for other general counseling and advice on contraception: Secondary | ICD-10-CM

## 2020-11-22 DIAGNOSIS — Z01419 Encounter for gynecological examination (general) (routine) without abnormal findings: Secondary | ICD-10-CM | POA: Insufficient documentation

## 2020-11-22 DIAGNOSIS — Z113 Encounter for screening for infections with a predominantly sexual mode of transmission: Secondary | ICD-10-CM | POA: Diagnosis not present

## 2020-11-22 LAB — POCT URINE PREGNANCY: Preg Test, Ur: NEGATIVE

## 2020-11-22 MED ORDER — NORETHIN ACE-ETH ESTRAD-FE 1-20 MG-MCG PO TABS: 1.0000 | ORAL_TABLET | Freq: Every day | ORAL | 11 refills | Status: DC

## 2020-11-22 NOTE — Progress Notes (Signed)
Subjective:     Erica Randall is a 20 y.o. female here for a routine exam.  Current complaints: none. Pts LMPs are irreg. Her LMP was late Feb lasting to Mar. The prev cycle was .   Gynecologic History Patient's last menstrual period was 11/11/2020. Contraception: depo provera Last Pap: n/a Last mammogram: n/a  Obstetric History OB History  Gravida Para Term Preterm AB Living  0 0 0 0 0 0  SAB IAB Ectopic Multiple Live Births  0 0 0 0 0   The following portions of the patient's history were reviewed and updated as appropriate: allergies, current medications, past family history, past medical history, past social history, past surgical history and problem list.  Review of Systems Pertinent items are noted in HPI.    Objective:  BP 117/63   Pulse 77   Ht 5\' 9"  (1.753 m)   Wt 225 lb (102.1 kg)   LMP 11/11/2020   BMI 33.23 kg/m  General Appearance:    Alert, cooperative, no distress, appears stated age  Head:    Normocephalic, without obvious abnormality, atraumatic  Eyes:    conjunctiva/corneas clear, EOM's intact, both eyes  Ears:    Normal external ear canals, both ears  Nose:   Nares normal, septum midline, mucosa normal, no drainage    or sinus tenderness  Throat:   Lips, mucosa, and tongue normal; teeth and gums normal  Neck:   Supple, symmetrical, trachea midline, no adenopathy;    thyroid:  no enlargement/tenderness/nodules  Back:     Symmetric, no curvature, ROM normal, no CVA tenderness  Lungs:     respirations unlabored  Chest Wall:    No tenderness or deformity   Heart:    Regular rate and rhythm  Breast Exam:    No tenderness, masses, or nipple abnormality  Abdomen:     Soft, non-tender, bowel sounds active all four quadrants,    no masses, no organomegaly  Genitalia:    Normal female without lesion, discharge or tenderness     Extremities:   Extremities normal, atraumatic, no cyanosis or edema  Pulses:   2+ and symmetric all extremities  Skin:   Skin color,  texture, turgor normal, no rashes or lesions    Assessment:    Healthy female exam.   AUB suspect PCOS STI screen   Plan:   Erica Randall was seen today for gynecologic exam.  Diagnoses and all orders for this visit:  Well female exam with routine gynecological exam -     Cytology - PAP( [place])  Routine screening for STI (sexually transmitted infection) -     Cytology - PAP( [place])  Encounter for counseling regarding contraception -     POCT urine pregnancy -     norethindrone-ethinyl estradiol (LOESTRIN FE) 1-20 MG-MCG tablet; Take 1 tablet by mouth daily.  Erica Randall L. Harraway-Smith, M.D., 11/13/2020

## 2020-11-23 LAB — CYTOLOGY - PAP
Chlamydia: POSITIVE — AB
Comment: NEGATIVE
Comment: NORMAL
Neisseria Gonorrhea: NEGATIVE

## 2020-11-25 ENCOUNTER — Telehealth: Payer: Self-pay

## 2020-11-25 ENCOUNTER — Ambulatory Visit: Payer: Self-pay

## 2020-11-25 DIAGNOSIS — A749 Chlamydial infection, unspecified: Secondary | ICD-10-CM

## 2020-11-25 MED ORDER — DOXYCYCLINE HYCLATE 100 MG PO CAPS: 100.0000 mg | ORAL_CAPSULE | Freq: Two times a day (BID) | ORAL | 0 refills | Status: DC

## 2020-11-25 NOTE — Telephone Encounter (Signed)
Pt returned call back to discuss results. Pt made aware that medication has been sent to her pharmacy for Chlamydia. Pt advised to not have sex for 10 days until after she and partner has been treated. Pt is also scheduled for a Colpo. Understanding was voiced. STD form was faxed to [facility]. Bastion Bolger l Cornelia Walraven, CMA

## 2020-11-25 NOTE — Telephone Encounter (Signed)
Called pt to discuss positive Chlamydia results and to schedule a Colpo due to abnormal pap smear. Left message for pt to call the office back. Erica Randall l Ibtisam Benge, CMA

## 2020-12-17 ENCOUNTER — Other Ambulatory Visit: Payer: Self-pay

## 2020-12-17 ENCOUNTER — Ambulatory Visit (INDEPENDENT_AMBULATORY_CARE_PROVIDER_SITE_OTHER): Payer: Medicaid Other

## 2020-12-17 ENCOUNTER — Other Ambulatory Visit (HOSPITAL_COMMUNITY)
Admission: RE | Admit: 2020-12-17 | Discharge: 2020-12-17 | Disposition: A | Discharge status: Home / Self Care | Payer: Medicaid Other | Source: Ambulatory Visit | Attending: Family Medicine | Admitting: Family Medicine

## 2020-12-17 VITALS — BP 117/75 | HR 76 | Ht 69.0 in | Wt 225.0 lb

## 2020-12-17 DIAGNOSIS — A64 Unspecified sexually transmitted disease: Secondary | ICD-10-CM

## 2020-12-17 DIAGNOSIS — Z113 Encounter for screening for infections with a predominantly sexual mode of transmission: Secondary | ICD-10-CM

## 2020-12-17 NOTE — Progress Notes (Signed)
Patient presents for test of cure from a positive Chlamydia test on her pap on 11-16-2020.  Patient submitted urine specimen for testing.  Armandina Stammer RN

## 2020-12-19 LAB — GC/CHLAMYDIA PROBE AMP (~~LOC~~) NOT AT ARMC
Chlamydia: NEGATIVE
Comment: NEGATIVE
Comment: NORMAL
Neisseria Gonorrhea: NEGATIVE

## 2021-01-27 ENCOUNTER — Ambulatory Visit: Payer: Medicaid Other | Admitting: Medical-Surgical

## 2021-02-10 ENCOUNTER — Other Ambulatory Visit (HOSPITAL_COMMUNITY)
Admission: RE | Admit: 2021-02-10 | Discharge: 2021-02-10 | Disposition: A | Discharge status: Home / Self Care | Payer: Medicaid Other | Source: Ambulatory Visit | Attending: Obstetrics & Gynecology | Admitting: Obstetrics & Gynecology

## 2021-02-10 ENCOUNTER — Other Ambulatory Visit: Payer: Self-pay

## 2021-02-10 ENCOUNTER — Encounter: Payer: Self-pay | Admitting: Obstetrics & Gynecology

## 2021-02-10 ENCOUNTER — Ambulatory Visit (INDEPENDENT_AMBULATORY_CARE_PROVIDER_SITE_OTHER): Payer: Medicaid Other | Admitting: Obstetrics & Gynecology

## 2021-02-10 VITALS — BP 120/50 | HR 70 | Wt 233.0 lb

## 2021-02-10 DIAGNOSIS — R87612 Low grade squamous intraepithelial lesion on cytologic smear of cervix (LGSIL): Secondary | ICD-10-CM | POA: Insufficient documentation

## 2021-02-10 DIAGNOSIS — Z113 Encounter for screening for infections with a predominantly sexual mode of transmission: Secondary | ICD-10-CM | POA: Diagnosis not present

## 2021-02-10 NOTE — Progress Notes (Signed)
Patient given informed consent, signed copy in the chart, time out was performed.  Placed in lithotomy position. Cervix viewed with speculum and colposcope after application of acetic acid.  11/22/2020 Negative    Chlamydia PositiveAbnormal   Adequacy Satisfactory for evaluation; transformation zone component PRESENT.   Diagnosis - Low grade squamous intraepithelial lesion (LSIL)Abnormal   Comment Normal Reference Ranger Chlamydia - Negative   Comment Normal Reference Range Neisseria Gonorrhea - Negative     Colposcopy adequate?  yes Acetowhite lesions? yes Punctation? no Mosaicism?  no Abnormal vasculature?  no Biopsies? yes ECC? no   Patient was given post procedure instructions.  She will return in 2 weeks for results. STI screen performed today.    Erica Randall L. Harraway-Smith, M.D., [place]

## 2021-02-10 NOTE — Patient Instructions (Signed)
https://www.acog.org/womens-[email or web address]/~/link.[email or web address]=[email or web address]">  Colposcopy  Colposcopy is a procedure to examine the lowest part of the uterus (cervix), the vagina, and the area around the vaginal opening (vulva) for abnormalities or signs of disease. This procedure is done using an instrument that makes objects appear larger and provides light. (colposcope). During the procedure, the health care provider may remove a tissue sample to be looked at later under a microscope (biopsy). A biopsy may be done if any unusual cells are found during the colposcopy. You may have a colposcopy if you have:  An abnormal Pap smear, also called a Pap test. This screening test is used to check for signs of cancer or infection of the vagina, cervix, and uterus.  An HPV (human papillomavirus) test and get a positive result for a type of HPV that puts you at high risk of cancer.  Certain conditions or symptoms, such as: ? A sore, or lesion, on your cervix. ? Genital warts on your vulva, vagina, or cervix. ? Pain during sex. ? Vaginal bleeding, especially after sex.  A growth on your cervix (cervical polyp) that needs to be removed. Let your health care provider know about:  Any allergies you have, including allergies to medicines, latex, or iodine.  All medicines you are taking, including vitamins, herbs, eye drops, creams, and over-the-counter medicines.  Any problems you or family members have had with anesthetic medicines.  Any blood disorders you have.  Any surgeries you have had.  Any medical conditions you have, such as pelvic inflammatory disease (PID) or endometrial disorder.  The pattern of your menstrual cycles and the form of birth control (contraception) you use, if any.  Your medical history, including any history of fainting often or of cervical treatment.  Whether you are pregnant or may be pregnant. What are the risks? Generally, this is a safe  procedure. However, problems may occur, including:  Infection. Symptoms of infection may include fever, bad-smelling vaginal discharge, or pelvic pain.  Allergic reactions to medicines.  Damage to nearby structures or organs.  Fainting. This is rare. What happens before the procedure? Eating and drinking restrictions  Follow instructions from your health care provider about eating or drinking restrictions.  You will likely need to eat a regular diet the day of the procedure and not skip any meals. Tests  You may have an exam or testing. A pregnancy test will be done the day of the procedure.  You may have a blood or urine sample taken. General instructions  Ask your health care provider about: ? Changing or stopping your regular medicines. This is especially important if you are taking diabetes medicines or blood thinners. ? Taking medicines such as aspirin and ibuprofen. These medicines can thin your blood. Do not take these medicines unless your health care provider tells you to take them. Your health care provider will likely tell you to avoid taking aspirin, or medicine that contains aspirin, for 7 days before the procedure. ? Taking over-the-counter medicines, vitamins, herbs, and supplements.  Tell your health care provider if you have your menstrual period now or will have it at the time of your procedure. A colposcopy is not normally done during your menstrual period.  If you use contraception, continue to use it before your procedure.  For 24 hours before the procedure: ? Do not use douche products or tampons. ? Do not use medicines, creams, or suppositories in the vagina. ? Do not have sex.  Ask your health care provider what steps will be   taken to prevent infection. What happens during the procedure?  You will lie down on your back, with your feet in foot rests (stirrups).  A tool called a speculum will be warmed and will have oil or gel put on it (will be  lubricated). The speculum will then be inserted into your vagina. This will be used to hold apart the walls of your vagina so your health care provider can see your cervix and the inside of your vagina.  A cotton swab will be used to place a small amount of a liquid (solution) on the areas to be examined. This solution makes it easier to see abnormal cells. You may feel a slight burning during this part.  The colposcope will be used to scan the cervix with a bright white light. The colposcope will be held near your vulva and will make your vulva, vagina, and cervix look bigger so they can be seen better.  If a biopsy is needed: ? You may be given a medicine to numb the area (local anesthetic). ? Surgical tools will be used to remove mucus and cells through your vagina. ? You may feel mild pain while the tissue sample is removed. ? Bleeding may occur. A solution may be used to stop the bleeding. ? If a biopsy is needed from the inside of the cervix, a different procedure called endocervical curettage (ECC) may be done. During this procedure, a curved tool called a curette will be used to scrape cells from your cervix or the top of your cervix (endocervix).  Any abnormalities that are found will be recorded. The procedure may vary among health care providers and hospitals. What happens after the procedure?  You will lie down and rest for a few minutes. You may be offered juice or cookies.  Your blood pressure, heart rate, breathing rate, and blood oxygen level will be monitored until you leave the hospital or clinic.  You may have some cramping in your abdomen. This should go away after a few minutes.  It is up to you to get the results of your procedure. Ask your health care provider, or the department that is doing the procedure, when your results will be ready. Summary  Colposcopy is a procedure to examine the lowest part of the uterus (cervix), the vagina, and the area around the vaginal  opening (vulva) for abnormalities or signs of disease.  A biopsy may be done as part of the procedure.  After the procedure, you will remain lying down and will rest for a few minutes.  You may have some cramping in your abdomen. This should go away after a few minutes. This information is not intended to replace advice given to you by your health care provider. Make sure you discuss any questions you have with your health care provider. Document Revised: 09/03/2019 Document Reviewed: 09/03/2019 Elsevier Patient Education  2021 Elsevier Inc. https://www.acog.org/Patients/[email or web address]/[email or web address]">  Colposcopy, Care After This sheet gives you information about how to care for yourself after your procedure. Your health care provider may also give you more specific instructions. If you have problems or questions, contact your health care provider. What can I expect after the procedure? If you had a colposcopy without a biopsy, you can expect to feel fine right away after your procedure. However, you may have some spotting of blood for a few days. You can return to your normal activities. If you had a colposcopy with a biopsy, it is common after the procedure to have:  Soreness and mild pain. These may last for a few days.  Light-headedness.  Mild vaginal bleeding or discharge that is dark-colored and grainy. This may last for a few days. The discharge may be caused by a liquid (solution) that was used during the procedure. You may need to wear a sanitary pad during this time.  Spotting of blood for at least 48 hours after the procedure. Follow these instructions at home: Medicines  Take over-the-counter and prescription medicines only as told by your health care provider.  Talk with your health care provider about what type of over-the-counter pain medicine and prescription medicine you can start to take again. It is especially important to talk with your health care provider if you take blood  thinners. Activity  Limit your physical activity for the first day after your procedure as told by your health care provider.  Avoid using douche products, using tampons, or having sex for at least 3 days after the procedure or for as long as told.  Return to your normal activities as told by your health care provider. Ask your health care provider what activities are safe for you. General instructions  Drink enough fluid to keep your urine pale yellow.  Ask your health care provider if you may take baths, swim, or use a hot tub. You may take showers.  If you use birth control (contraception), continue to use it.  Keep all follow-up visits as told by your health care provider. This is important.   Contact a health care provider if:  You develop a skin rash. Get help right away if:  You bleed a lot from your vagina or pass blood clots. This includes using more than one sanitary pad each hour for 2 hours in a row.  You have a fever or chills.  You have vaginal discharge that is abnormal, is yellow in color, or smells bad. This could be a sign of infection.  You have severe pain or cramps in your lower abdomen that do not go away with medicine.  You faint. Summary  If you had a colposcopy without a biopsy, you can expect to feel fine right away, but you may have some spotting of blood for a few days. You can return to your normal activities.  If you had a colposcopy with a biopsy, it is common to have mild pain for a few days and spotting for 48 hours after the procedure.  Avoid using douche products, using tampons, and having sex for at least 3 days after the procedure or for as long as told by your health care provider.  Get help right away if you have heavy bleeding, severe pain, or signs of infection. This information is not intended to replace advice given to you by your health care provider. Make sure you discuss any questions you have with your health care  provider. Document Revised: 09/03/2019 Document Reviewed: 09/03/2019 Elsevier Patient Education  2021 [employer].

## 2021-02-11 LAB — GC/CHLAMYDIA PROBE AMP (~~LOC~~) NOT AT ARMC
Chlamydia: NEGATIVE
Comment: NEGATIVE
Comment: NORMAL
Neisseria Gonorrhea: NEGATIVE

## 2021-02-11 LAB — SURGICAL PATHOLOGY

## 2021-02-16 ENCOUNTER — Encounter: Payer: Self-pay | Admitting: General Practice

## 2021-02-17 ENCOUNTER — Telehealth: Payer: Self-pay

## 2021-02-17 NOTE — Telephone Encounter (Signed)
-----   Message from Willodean Rosenthal, MD sent at 02/16/2021  4:32 PM EDT ----- Please call pt. Her colpo bx show low grade changes. We can just watch for now. She will need a repeat PAP in 1 year. Her cx were also neg.   Thx, Clh-S

## 2021-02-17 NOTE — Telephone Encounter (Signed)
Called pt to discuss Colpo results. Pt made aware that her Colpo showed low grade changes and her GC/CH was negative. Pt advised to repeat Pap in 1 year. Understanding was voiced. Makaveli Hoard l Laisha Rau, CMA

## 2021-03-04 ENCOUNTER — Ambulatory Visit: Payer: Medicaid Other | Admitting: Obstetrics & Gynecology

## 2021-11-23 ENCOUNTER — Ambulatory Visit (INDEPENDENT_AMBULATORY_CARE_PROVIDER_SITE_OTHER): Payer: Medicaid Other | Admitting: Obstetrics & Gynecology

## 2021-11-23 ENCOUNTER — Encounter: Payer: Self-pay | Admitting: Obstetrics & Gynecology

## 2021-11-23 ENCOUNTER — Other Ambulatory Visit (HOSPITAL_COMMUNITY)
Admission: RE | Admit: 2021-11-23 | Discharge: 2021-11-23 | Disposition: A | Discharge status: Home / Self Care | Payer: Medicaid Other | Source: Ambulatory Visit | Attending: Obstetrics & Gynecology | Admitting: Obstetrics & Gynecology

## 2021-11-23 ENCOUNTER — Other Ambulatory Visit: Payer: Self-pay

## 2021-11-23 VITALS — BP 116/63 | HR 92 | Ht 69.0 in | Wt 219.0 lb

## 2021-11-23 DIAGNOSIS — Z01419 Encounter for gynecological examination (general) (routine) without abnormal findings: Secondary | ICD-10-CM | POA: Diagnosis not present

## 2021-11-23 DIAGNOSIS — Z113 Encounter for screening for infections with a predominantly sexual mode of transmission: Secondary | ICD-10-CM

## 2021-11-23 DIAGNOSIS — R87612 Low grade squamous intraepithelial lesion on cytologic smear of cervix (LGSIL): Secondary | ICD-10-CM

## 2021-11-23 DIAGNOSIS — N841 Polyp of cervix uteri: Secondary | ICD-10-CM | POA: Insufficient documentation

## 2021-11-23 NOTE — Progress Notes (Signed)
Subjective:  ? 10/17/2021. ? Erica Randall is a 21 y.o. female here for a routine exam.  Current complaints: none. Pt reports that she is not currently sexually active. Pt is s/p a recent breakup. She reports that she is concentrating on school and getting herself together.   She reports that her last cycle was 10/17/21. Pt reports prev abnoraml cycles that were reg with OCPs.    ?  ?Gynecologic History ?Patient's last menstrual period was 10/17/2021 (exact date). ?Contraception: abstinence ?Last Pap: 11/22/2020. Results were: abnormal ?Last mammogram: n/a.  ? ?Obstetric History ?OB History  ?Gravida Para Term Preterm AB Living  ?0 0 0 0 0 0  ?SAB IAB Ectopic Multiple Live Births  ?0 0 0 0 0  ? ? ? ?The following portions of the patient's history were reviewed and updated as appropriate: allergies, current medications, past family history, past medical history, past social history, past surgical history, and problem list. ? ?Review of Systems ?Pertinent items are noted in HPI.  ?  ?Objective:  ?BP 116/63   Pulse 92   Ht 5\' 9"  (1.753 m)   Wt 219 lb (99.3 kg)   LMP 10/17/2021 (Exact Date)   BMI 32.34 kg/m?  ? ?General Appearance:    Alert, cooperative, no distress, appears stated age  ?Head:    Normocephalic, without obvious abnormality, atraumatic  ?Eyes:    conjunctiva/corneas clear, EOM's intact, both eyes  ?Ears:    Normal external ear canals, both ears  ?Nose:   Nares normal, septum midline, mucosa normal, no drainage    or sinus tenderness  ?Throat:   Lips, mucosa, and tongue normal; teeth and gums normal  ?Neck:   Supple, symmetrical, trachea midline, no adenopathy;  ?  thyroid:  no enlargement/tenderness/nodules  ?Back:     Symmetric, no curvature, ROM normal, no CVA tenderness  ?Lungs:     respirations unlabored  ?Chest Wall:    No tenderness or deformity  ? Heart:    Regular rate and rhythm  ?Breast Exam:    No tenderness, masses, or nipple abnormality  ?Abdomen:     Soft, non-tender, bowel sounds active  all four quadrants,  ?  no masses, no organomegaly  ?Genitalia:    Normal female without lesion, discharge or tenderness  ? Small polyp was noted at the cervical os. This was removed with long Kelly forceps.   ?Extremities:   Extremities normal, atraumatic, no cyanosis or edema  ?Pulses:   2+ and symmetric all extremities  ?Skin:   Skin color, texture, turgor normal, no rashes or lesions  ? UPT: neg ? ?Assessment:  ? ? Healthy female exam.  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 Done on PAP ?  ?Plan:  ?Erica Randall was seen today for gynecologic exam. ? ?Diagnoses and all orders for this visit: ? ?Well female exam with routine gynecological exam ?-     Cytology - PAP( [place]) ? ?Cervical polyp ?-     Surgical pathology ? ?Low grade squamous intraepithelial lesion (LGSIL) on Papanicolaou smear of cervix ? ?Routine screening for STI (sexually transmitted infection) ? ? F/u pap and surg path. If both neg, rec repeat PAP in 1 year.   ? ?Erica Randall, M.D., FACOG ? ?

## 2021-11-25 LAB — SURGICAL PATHOLOGY

## 2021-11-30 LAB — CYTOLOGY - PAP
Chlamydia: NEGATIVE
Comment: NEGATIVE
Comment: NEGATIVE
Comment: NEGATIVE
Comment: NORMAL
Diagnosis: UNDETERMINED — AB
High risk HPV: NEGATIVE
Neisseria Gonorrhea: NEGATIVE
Trichomonas: NEGATIVE

## 2021-12-15 ENCOUNTER — Telehealth: Payer: Self-pay

## 2021-12-15 NOTE — Telephone Encounter (Signed)
Patient made aware of need for repeat pap smear in one year. Armandina Stammer RN ?

## 2021-12-15 NOTE — Telephone Encounter (Signed)
-----   Message from Willodean Rosenthal, MD sent at 12/14/2021  4:58 PM EDT ----- ?Please call pt. Her bx was WNL. She needs a repeat lab in 1 year for ASCUS neg hrHPV on PAP.  ? ?Thanks ? ?Clh-S  ?

## 2021-12-20 DIAGNOSIS — G4489 Other headache syndrome: Secondary | ICD-10-CM | POA: Diagnosis not present

## 2021-12-20 DIAGNOSIS — J029 Acute pharyngitis, unspecified: Secondary | ICD-10-CM | POA: Diagnosis not present

## 2021-12-20 DIAGNOSIS — R5383 Other fatigue: Secondary | ICD-10-CM | POA: Diagnosis not present

## 2022-04-12 ENCOUNTER — Other Ambulatory Visit (HOSPITAL_COMMUNITY)
Admission: RE | Admit: 2022-04-12 | Discharge: 2022-04-12 | Disposition: A | Discharge status: Home / Self Care | Payer: Medicaid Other | Source: Ambulatory Visit | Attending: Obstetrics & Gynecology | Admitting: Obstetrics & Gynecology

## 2022-04-12 ENCOUNTER — Ambulatory Visit: Payer: Medicaid Other

## 2022-04-12 VITALS — BP 111/62 | HR 89 | Ht 70.0 in | Wt 220.0 lb

## 2022-04-12 DIAGNOSIS — Z113 Encounter for screening for infections with a predominantly sexual mode of transmission: Secondary | ICD-10-CM

## 2022-04-12 NOTE — Progress Notes (Unsigned)
SUBJECTIVE:  21 y.o. female complains of screening for std. Denies abnormal vaginal bleeding or significant pelvic pain or fever. No UTI symptoms. Denies history of known exposure to STD.  LMP07-06-23  OBJECTIVE:  She appears well, afebrile. No currently using any birth control.   ASSESSMENT:  Patient would just like screening for GC/CHl/ trich. Since having unprotected intercourse.   PLAN:  GC, chlamydia, trichomonas,probe sent to lab.patient denies any bloodwork for STD screening. Treatment: To be determined once lab results are received ROV prn if symptoms persist or worsen.   Victorino Dike [facility]

## 2022-04-13 LAB — CERVICOVAGINAL ANCILLARY ONLY
Chlamydia: NEGATIVE
Comment: NEGATIVE
Comment: NEGATIVE
Comment: NORMAL
Neisseria Gonorrhea: NEGATIVE
Trichomonas: NEGATIVE

## 2022-04-14 ENCOUNTER — Ambulatory Visit: Payer: Medicaid Other

## 2022-05-12 ENCOUNTER — Ambulatory Visit: Payer: Medicaid Other

## 2022-05-19 ENCOUNTER — Telehealth: Payer: Self-pay | Admitting: General Practice

## 2022-05-19 NOTE — Telephone Encounter (Signed)
Pt sent message via Mychart to schedule for STD/UTI testing.  Called pt and informed her that she may want to try her PCP or Urgent Care due to our office closing today at noon and will open on Tuesday, 05/23/2022.  Pt verbalized understanding.

## 2022-06-24 DIAGNOSIS — A64 Unspecified sexually transmitted disease: Secondary | ICD-10-CM | POA: Diagnosis not present

## 2022-06-24 DIAGNOSIS — Z113 Encounter for screening for infections with a predominantly sexual mode of transmission: Secondary | ICD-10-CM | POA: Diagnosis not present

## 2022-07-11 ENCOUNTER — Ambulatory Visit: Payer: Medicaid Other | Admitting: Family Medicine

## 2022-10-04 ENCOUNTER — Encounter: Payer: Self-pay | Admitting: Obstetrics & Gynecology

## 2022-10-04 ENCOUNTER — Ambulatory Visit: Payer: Medicaid Other | Admitting: Obstetrics & Gynecology

## 2022-10-04 ENCOUNTER — Other Ambulatory Visit (HOSPITAL_COMMUNITY)
Admission: RE | Admit: 2022-10-04 | Discharge: 2022-10-04 | Disposition: A | Discharge status: Home / Self Care | Payer: Medicaid Other | Source: Ambulatory Visit | Attending: Obstetrics & Gynecology | Admitting: Obstetrics & Gynecology

## 2022-10-04 VITALS — BP 119/64 | HR 110 | Wt 251.0 lb

## 2022-10-04 DIAGNOSIS — Z113 Encounter for screening for infections with a predominantly sexual mode of transmission: Secondary | ICD-10-CM | POA: Insufficient documentation

## 2022-10-04 DIAGNOSIS — N898 Other specified noninflammatory disorders of vagina: Secondary | ICD-10-CM

## 2022-10-04 DIAGNOSIS — Z3009 Encounter for other general counseling and advice on contraception: Secondary | ICD-10-CM | POA: Diagnosis not present

## 2022-10-04 DIAGNOSIS — Z3202 Encounter for pregnancy test, result negative: Secondary | ICD-10-CM

## 2022-10-04 LAB — POCT URINE PREGNANCY: Preg Test, Ur: NEGATIVE

## 2022-10-04 MED ORDER — NORGESTIMATE-ETH ESTRADIOL 0.25-35 MG-MCG PO TABS: 1.0000 | ORAL_TABLET | Freq: Every day | ORAL | 11 refills | Status: DC

## 2022-10-04 NOTE — Progress Notes (Signed)
History:  22 y.o. G0P0000 here today for eval of foul smelling discharge. Pt wants to be screened for STIs. Pt reports new partner. They use condoms most of the time. She also reports that she restarted OCPs. Her friend has pills that she wasn't taking and pt stared those. She reports that her cycles which have been abnormal have corrected. She has had a h/o irreg cycles.     The following portions of the patient's history were reviewed and updated as appropriate: allergies, current medications, past family history, past medical history, past social history, past surgical history and problem list.  Review of Systems:  Pertinent items are noted in HPI.    Objective:  Physical Exam Blood pressure 119/64, pulse (!) 110, weight 251 lb (113.9 kg), last menstrual period 08/28/2022.  CONSTITUTIONAL: Well-developed, well-nourished female in no acute distress.  HENT:  Normocephalic, atraumatic EYES: Conjunctivae and EOM are normal. No scleral icterus.  NECK: Normal range of motion SKIN: Skin is warm and dry. No rash noted. Not diaphoretic.No pallor. [place]: Alert and oriented to person, place, and time. Normal coordination.  Pelvic: Pt is s/p self swab.    Assessment & Plan:  Diagnoses and all orders for this visit:  Routine screening for STI (sexually transmitted infection) -     Cervicovaginal ancillary only( [place]) -     POCT urine pregnancy  Encounter for counseling regarding contraception -     norgestimate-ethinyl estradiol (ORTHO-CYCLEN) 0.25-35 MG-MCG tablet; Take 1 tablet by mouth daily.  Vaginal odor -     Cervicovaginal ancillary only( [place]) -     POCT urine pregnancy   Reviewed all forms of birth control options available including abstinence; over the counter/barrier methods; hormonal contraceptive medication including pill, patch, ring, injection,contraceptive implant; hormonal and nonhormonal IUDs; permanent sterilization options including vasectomy and the  various tubal sterilization modalities. Risks and benefits reviewed.  Questions were answered.  Information was given to patient to review.    Pt counseling against taking meds from others and the potential danger there.   Breda Bond L. Harraway-Smith, M.D., [place]lynn June

## 2022-10-06 LAB — CERVICOVAGINAL ANCILLARY ONLY
Bacterial Vaginitis (gardnerella): POSITIVE — AB
Candida Glabrata: NEGATIVE
Candida Vaginitis: NEGATIVE
Chlamydia: NEGATIVE
Comment: NEGATIVE
Comment: NEGATIVE
Comment: NEGATIVE
Comment: NEGATIVE
Comment: NEGATIVE
Comment: NORMAL
Neisseria Gonorrhea: NEGATIVE
Trichomonas: NEGATIVE

## 2022-10-09 ENCOUNTER — Other Ambulatory Visit: Payer: Self-pay

## 2022-10-09 DIAGNOSIS — B9689 Other specified bacterial agents as the cause of diseases classified elsewhere: Secondary | ICD-10-CM

## 2022-10-09 MED ORDER — METRONIDAZOLE 500 MG PO TABS: 500.0000 mg | ORAL_TABLET | Freq: Two times a day (BID) | ORAL | 0 refills | Status: DC

## 2022-10-09 NOTE — Progress Notes (Signed)
Patient tested positive for BV. Flagyl 500 mg BID x 7 days was sent to her pharmacy.  Cieanna Stormes l Orla Estrin, CMA

## 2022-10-11 ENCOUNTER — Other Ambulatory Visit: Payer: Self-pay | Admitting: Obstetrics & Gynecology

## 2023-02-05 ENCOUNTER — Ambulatory Visit (INDEPENDENT_AMBULATORY_CARE_PROVIDER_SITE_OTHER): Payer: 59

## 2023-02-05 ENCOUNTER — Other Ambulatory Visit (HOSPITAL_COMMUNITY)
Admission: RE | Admit: 2023-02-05 | Discharge: 2023-02-05 | Disposition: A | Discharge status: Home / Self Care | Payer: 59 | Source: Ambulatory Visit | Attending: Obstetrics and Gynecology | Admitting: Obstetrics and Gynecology

## 2023-02-05 VITALS — BP 127/75 | HR 100 | Wt 265.0 lb

## 2023-02-05 DIAGNOSIS — Z113 Encounter for screening for infections with a predominantly sexual mode of transmission: Secondary | ICD-10-CM

## 2023-02-05 DIAGNOSIS — N898 Other specified noninflammatory disorders of vagina: Secondary | ICD-10-CM | POA: Insufficient documentation

## 2023-02-05 NOTE — Progress Notes (Signed)
SUBJECTIVE:  22 y.o. female complains of white vaginal discharge for 4 month(s). Denies abnormal vaginal bleeding or significant pelvic pain or fever. No UTI symptoms. Denies history of known exposure to STD.  Patient's last menstrual period was 12/25/2022 (approximate).  OBJECTIVE:  She appears well, afebrile. Urine dipstick: not done.  ASSESSMENT:  Vaginal Discharge  Vaginal Odor   PLAN:  GC, chlamydia, trichomonas, BVAG, CVAG probe sent to lab. Treatment: To be determined once lab results are received ROV prn if symptoms persist or worsen.   Kayin Osment l Taniya Dasher, CMA

## 2023-02-07 ENCOUNTER — Other Ambulatory Visit: Payer: Self-pay | Admitting: Obstetrics and Gynecology

## 2023-02-07 DIAGNOSIS — B9689 Other specified bacterial agents as the cause of diseases classified elsewhere: Secondary | ICD-10-CM

## 2023-02-07 LAB — HEPATITIS C ANTIBODY: Hep C Virus Ab: NONREACTIVE

## 2023-02-07 LAB — CERVICOVAGINAL ANCILLARY ONLY
Bacterial Vaginitis (gardnerella): POSITIVE — AB
Candida Glabrata: NEGATIVE
Candida Vaginitis: NEGATIVE
Chlamydia: NEGATIVE
Comment: NEGATIVE
Comment: NEGATIVE
Comment: NEGATIVE
Comment: NEGATIVE
Comment: NEGATIVE
Comment: NORMAL
Neisseria Gonorrhea: NEGATIVE
Trichomonas: NEGATIVE

## 2023-02-07 LAB — HEPATITIS B SURFACE ANTIGEN: Hepatitis B Surface Ag: NEGATIVE

## 2023-02-07 LAB — RPR: RPR Ser Ql: NONREACTIVE

## 2023-02-07 LAB — HIV ANTIBODY (ROUTINE TESTING W REFLEX): HIV Screen 4th Generation wRfx: NONREACTIVE

## 2023-02-07 MED ORDER — METRONIDAZOLE 500 MG PO TABS: 500.0000 mg | ORAL_TABLET | Freq: Two times a day (BID) | ORAL | 0 refills | Status: AC

## 2023-06-17 ENCOUNTER — Telehealth: Payer: Medicaid Other | Admitting: Family

## 2023-06-17 DIAGNOSIS — U071 COVID-19: Secondary | ICD-10-CM

## 2023-06-17 MED ORDER — FLUTICASONE PROPIONATE 50 MCG/ACT NA SUSP
2.0000 | Freq: Every day | NASAL | 6 refills | Status: DC
Start: 2023-06-17 — End: 2023-07-23

## 2023-06-17 MED ORDER — ONDANSETRON HCL 4 MG PO TABS: 4.0000 mg | ORAL_TABLET | Freq: Three times a day (TID) | ORAL | 0 refills | Status: DC | PRN

## 2023-06-17 MED ORDER — BENZONATATE 100 MG PO CAPS: 100.0000 mg | ORAL_CAPSULE | Freq: Three times a day (TID) | ORAL | 0 refills | Status: DC | PRN

## 2023-06-17 NOTE — Progress Notes (Signed)
Virtual Visit Consent   Erica Randall, you are scheduled for a virtual visit with a [facility] provider today. Just as with appointments in the office, your consent must be obtained to participate. Your consent will be active for this visit and any virtual visit you may have with one of our providers in the next 365 days. If you have a MyChart account, a copy of this consent can be sent to you electronically.  As this is a virtual visit, video technology does not allow for your provider to perform a traditional examination. This may limit your provider's ability to fully assess your condition. If your provider identifies any concerns that need to be evaluated in person or the need to arrange testing (such as labs, EKG, etc.), we will make arrangements to do so. Although advances in technology are sophisticated, we cannot ensure that it will always work on either your end or our end. If the connection with a video visit is poor, the visit may have to be switched to a telephone visit. With either a video or telephone visit, we are not always able to ensure that we have a secure connection.  By engaging in this virtual visit, you consent to the provision of healthcare and authorize for your insurance to be billed (if applicable) for the services provided during this visit. Depending on your insurance coverage, you may receive a charge related to this service.  I need to obtain your verbal consent now. Are you willing to proceed with your visit today? Akeria Roscher has provided verbal consent on 06/17/2023 for a virtual visit (video or telephone). Jannifer Rodney, FNP  Date: 06/17/2023 3:14 PM  Virtual Visit via Video Note   I, Jannifer Rodney, connected with  Erica Randall  ([id number], 10-Jan-2001) on 06/17/23 at  3:30 PM EDT by a video-enabled telemedicine application and verified that I am speaking with the correct person using two identifiers.  Location: Patient: Virtual Visit Location Patient:  Home Provider: Virtual Visit Location Provider: Home Office   I discussed the limitations of evaluation and management by telemedicine and the availability of in person appointments. The patient expressed understanding and agreed to proceed.    History of Present Illness: Erica Randall is a 22 y.o. who identifies as a female who was assigned female at birth, and is being seen today for COVID. She reports her symptoms started three days ago and has worsen.   HPI: URI  This is a new problem. The current episode started in the past 7 days. The problem has been gradually worsening. There has been no fever. Associated symptoms include congestion, coughing, diarrhea, ear pain, headaches, nausea, rhinorrhea, sinus pain and sneezing. Pertinent negatives include no sore throat. She has tried decongestant for the symptoms. The treatment provided mild relief.    Problems: There are no problems to display for this patient.   Allergies: No Known Allergies Medications:  Current Outpatient Medications:    benzonatate (TESSALON PERLES) 100 MG capsule, Take 1 capsule (100 mg total) by mouth 3 (three) times daily as needed., Disp: 20 capsule, Rfl: 0   fluticasone (FLONASE) 50 MCG/ACT nasal spray, Place 2 sprays into both nostrils daily., Disp: 16 g, Rfl: 6   ondansetron (ZOFRAN) 4 MG tablet, Take 1 tablet (4 mg total) by mouth every 8 (eight) hours as needed for nausea or vomiting., Disp: 20 tablet, Rfl: 0  Observations/Objective: Patient is well-developed, well-nourished in no acute distress.  Resting comfortably  at home.  Head is normocephalic, atraumatic.  No labored breathing.  Speech is clear and coherent with logical content.  Patient is alert and oriented at baseline.  Nasal congestion  Assessment and Plan: 1. COVID-19 - ondansetron (ZOFRAN) 4 MG tablet; Take 1 tablet (4 mg total) by mouth every 8 (eight) hours as needed for nausea or vomiting.  Dispense: 20 tablet; Refill: 0 - fluticasone  (FLONASE) 50 MCG/ACT nasal spray; Place 2 sprays into both nostrils daily.  Dispense: 16 g; Refill: 6 - benzonatate (TESSALON PERLES) 100 MG capsule; Take 1 capsule (100 mg total) by mouth 3 (three) times daily as needed.  Dispense: 20 capsule; Refill: 0  - Take meds as prescribed - Use a cool mist humidifier  -Use saline nose sprays frequently -Force fluids -For any cough or congestion  Use plain Mucinex- regular strength or max strength is fine -For fever or aces or pains- take tylenol or ibuprofen. -Throat lozenges if help -Follow up if symptoms worsen or do not improve   Follow Up Instructions: I discussed the assessment and treatment plan with the patient. The patient was provided an opportunity to ask questions and all were answered. The patient agreed with the plan and demonstrated an understanding of the instructions.  A copy of instructions were sent to the patient via MyChart unless otherwise noted below.     The patient was advised to call back or seek an in-person evaluation if the symptoms worsen or if the condition fails to improve as anticipated.  Time:  I spent 6 minutes with the patient via telehealth technology discussing the above problems/concerns.    Jannifer Rodney, FNP

## 2023-06-17 NOTE — Patient Instructions (Signed)

## 2023-07-23 ENCOUNTER — Encounter (HOSPITAL_BASED_OUTPATIENT_CLINIC_OR_DEPARTMENT_OTHER): Payer: Self-pay | Admitting: Family Medicine

## 2023-07-23 ENCOUNTER — Ambulatory Visit (INDEPENDENT_AMBULATORY_CARE_PROVIDER_SITE_OTHER): Payer: Medicaid Other | Admitting: Family Medicine

## 2023-07-23 VITALS — BP 132/82 | HR 94 | Ht 70.0 in | Wt 268.9 lb

## 2023-07-23 DIAGNOSIS — F419 Anxiety disorder, unspecified: Secondary | ICD-10-CM | POA: Diagnosis not present

## 2023-07-23 DIAGNOSIS — Z23 Encounter for immunization: Secondary | ICD-10-CM

## 2023-07-23 DIAGNOSIS — Z0000 Encounter for general adult medical examination without abnormal findings: Secondary | ICD-10-CM

## 2023-07-23 DIAGNOSIS — F32A Depression, unspecified: Secondary | ICD-10-CM

## 2023-07-23 DIAGNOSIS — Z113 Encounter for screening for infections with a predominantly sexual mode of transmission: Secondary | ICD-10-CM | POA: Diagnosis not present

## 2023-07-23 DIAGNOSIS — E66812 Obesity, class 2: Secondary | ICD-10-CM | POA: Diagnosis not present

## 2023-07-23 MED ORDER — ESCITALOPRAM OXALATE 5 MG PO TABS: 5.0000 mg | ORAL_TABLET | Freq: Every day | ORAL | 1 refills | Status: DC

## 2023-07-23 NOTE — Progress Notes (Signed)
New Patient Office Visit  Subjective    Patient ID: Erica Randall, female    DOB: [birth year]  Age: 22 y.o. MRN: [id number]  CC:  Chief Complaint  Patient presents with   New Patient (Initial Visit)    New patient pt had canker sore that was really sore thinks it came from yeast infection in the mouth would like labs drawn and would like a referral to endocrinologist and nutritionist     HPI Erica Randall presents to establish care Last PCP - none, has been seeing OB/GYN  Requesting referrals - endocrinology, psychiatry, nutritionist  Reports concerns with negative thoughts with fluctuations in mood. Denies any prior treatment or medications prescribed in the past. Reports some depression and anxiety in her family. She also has concerns about substance abuse. Denies any SI or HI, although some mention of SI in the past. No suicidal actions in the past, has had thoughts in the past that it would be better if she was gone.  Reports FH of HTN in mom  Patient is originally from [place], [place]. Has lived here for about 20 years. Patient works at [employer] in [place], [place]. Outside of work, she enjoys hanging out with friends.  Outpatient Encounter Medications as of 07/23/2023  Medication Sig   escitalopram (LEXAPRO) 5 MG tablet Take 1 tablet (5 mg total) by mouth daily.   [DISCONTINUED] benzonatate (TESSALON PERLES) 100 MG capsule Take 1 capsule (100 mg total) by mouth 3 (three) times daily as needed. (Patient not taking: Reported on 07/23/2023)   [DISCONTINUED] fluticasone (FLONASE) 50 MCG/ACT nasal spray Place 2 sprays into both nostrils daily. (Patient not taking: Reported on 07/23/2023)   [DISCONTINUED] ondansetron (ZOFRAN) 4 MG tablet Take 1 tablet (4 mg total) by mouth every 8 (eight) hours as needed for nausea or vomiting. (Patient not taking: Reported on 07/23/2023)   No facility-administered encounter medications on file as of 07/23/2023.    Past Medical History:  Diagnosis Date   PCOS  (polycystic ovarian syndrome) 2021    History reviewed. No pertinent surgical history.  History reviewed. No pertinent family history.  Social History   Socioeconomic History   Marital status: Single    Spouse name: Not on file   Number of children: Not on file   Years of education: Not on file   Highest education level: Not on file  Occupational History   Not on file  Tobacco Use   Smoking status: Never    Passive exposure: Never   Smokeless tobacco: Never  Vaping Use   Vaping status: Never Used  Substance and Sexual Activity   Alcohol use: Yes    Alcohol/week: 2.0 standard drinks of alcohol    Types: 2 Glasses of wine per week   Drug use: Yes    Types: Marijuana   Sexual activity: Yes    Birth control/protection: Injection    Comment: started Depo shot 01/23/2019  Other Topics Concern   Not on file  Social History Narrative   Not on file   Social Determinants of Health   Financial Resource Strain: Not on file  Food Insecurity: Not on file  Transportation Needs: Not on file  Physical Activity: Not on file  Stress: Not on file  Social Connections: Not on file  Intimate Partner Violence: Not on file    Objective    BP 132/82 (BP Location: Right Arm, Patient Position: Sitting, Cuff Size: Normal)   Pulse 94   Ht 5\' 10"  (1.778 m)   Wt  268 lb 14.4 oz (122 kg)   SpO2 99%   BMI 38.58 kg/m   Physical Exam  22 year old female in no acute distress Cardiovascular exam with regular rate and rhythm Lungs clear to auscultation bilaterally  Assessment & Plan:   Anxiety and depression Assessment & Plan: Patient history consistent with underlying depression and anxiety.  PHQ-9 with score of 13 and GAD-7 score of 8.  We reviewed diagnoses and also discussed treatment considerations related to both medication and non medication options.  After discussion, patient was interested in proceeding with starting medication at this time.  Will start with low-dose of  escitalopram as below.  Discussed potential side effects/adverse reactions.  We will plan to follow-up in about 2 weeks to assess progress and medication or sooner as needed.  Discussed role of counseling.  She would be interested in referral to counselor/therapist, referral placed today  Orders: -     Ambulatory referral to Psychology  Encounter for immunization -     Flu vaccine trivalent PF, 6mos and older(Flulaval,Afluria,Fluarix,Fluzone)  Obesity, Class II, BMI 35-39.9 Assessment & Plan: We will proceed with initial baseline labs in conjunction with upcoming physical.  She is interested in meeting with nutritionist, referral placed today  Orders: -     Amb ref to Medical Nutrition Therapy-MNT  Wellness examination -     CBC with Differential/Platelet -     Comprehensive metabolic panel -     Hemoglobin A1c -     Lipid panel -     TSH Rfx on Abnormal to Free T4  Routine screening for STI (sexually transmitted infection) Assessment & Plan: Requesting to have STI screening completed today, orders placed.  She denies any symptoms at this time  Orders: -     RPR -     HIV Antibody (routine testing w rflx) -     Ct, Ng, Mycoplasmas NAA, Urine  Other orders -     Escitalopram Oxalate; Take 1 tablet (5 mg total) by mouth daily.  Dispense: 30 tablet; Refill: 1  Discussed that related to request for referral to endocrinology, we can complete initial testing in our office related to screening for any underlying thyroid disorder as well as assessing for any blood sugar issues.  If further assistance regarding treatment for any abnormalities identified is required, then we can proceed with referral at that time.  Patient voiced understanding and agreement.  Return in about 2 weeks (around 08/06/2023) for med check, can be virtual.    ___________________________________________ Ala Kratz de [place], MD, ABFM, [facility] Primary Care and Sports Medicine [facility]

## 2023-07-23 NOTE — Assessment & Plan Note (Signed)
We will proceed with initial baseline labs in conjunction with upcoming physical.  She is interested in meeting with nutritionist, referral placed today

## 2023-07-23 NOTE — Assessment & Plan Note (Signed)
Patient history consistent with underlying depression and anxiety.  PHQ-9 with score of 13 and GAD-7 score of 8.  We reviewed diagnoses and also discussed treatment considerations related to both medication and non medication options.  After discussion, patient was interested in proceeding with starting medication at this time.  Will start with low-dose of escitalopram as below.  Discussed potential side effects/adverse reactions.  We will plan to follow-up in about 2 weeks to assess progress and medication or sooner as needed.  Discussed role of counseling.  She would be interested in referral to counselor/therapist, referral placed today

## 2023-07-23 NOTE — Assessment & Plan Note (Addendum)
Requesting to have STI screening completed today, orders placed.  She denies any symptoms at this time

## 2023-07-25 LAB — RPR: RPR Ser Ql: NONREACTIVE

## 2023-07-25 LAB — COMPREHENSIVE METABOLIC PANEL
ALT: 16 [IU]/L (ref 0–32)
AST: 25 [IU]/L (ref 0–40)
Albumin: 4.5 g/dL (ref 4.0–5.0)
Alkaline Phosphatase: 86 [IU]/L (ref 44–121)
BUN/Creatinine Ratio: 12 (ref 9–23)
BUN: 9 mg/dL (ref 6–20)
Bilirubin Total: 0.2 mg/dL (ref 0.0–1.2)
CO2: 21 mmol/L (ref 20–29)
Calcium: 9.4 mg/dL (ref 8.7–10.2)
Chloride: 102 mmol/L (ref 96–106)
Creatinine, Ser: 0.77 mg/dL (ref 0.57–1.00)
Globulin, Total: 3.8 g/dL (ref 1.5–4.5)
Glucose: 91 mg/dL (ref 70–99)
Potassium: 4.5 mmol/L (ref 3.5–5.2)
Sodium: 139 mmol/L (ref 134–144)
Total Protein: 8.3 g/dL (ref 6.0–8.5)
eGFR: 110 mL/min/{1.73_m2} (ref >59)

## 2023-07-25 LAB — LIPID PANEL
Chol/HDL Ratio: 2 ratio (ref 0.0–4.4)
Cholesterol, Total: 158 mg/dL (ref 100–199)
HDL: 79 mg/dL (ref >39)
LDL Chol Calc (NIH): 69 mg/dL (ref 0–99)
Triglycerides: 47 mg/dL (ref 0–149)
VLDL Cholesterol Cal: 10 mg/dL (ref 5–40)

## 2023-07-25 LAB — TSH RFX ON ABNORMAL TO FREE T4: TSH: 0.567 u[IU]/mL (ref 0.450–4.500)

## 2023-07-25 LAB — CBC WITH DIFFERENTIAL/PLATELET
Basophils Absolute: 0 10*3/uL (ref 0.0–0.2)
Basos: 1 %
EOS (ABSOLUTE): 0.4 10*3/uL (ref 0.0–0.4)
Eos: 9 %
Hematocrit: 43.7 % (ref 34.0–46.6)
Hemoglobin: 13.5 g/dL (ref 11.1–15.9)
Immature Grans (Abs): 0 10*3/uL (ref 0.0–0.1)
Immature Granulocytes: 0 %
Lymphocytes Absolute: 1.4 10*3/uL (ref 0.7–3.1)
Lymphs: 34 %
MCH: 24.9 pg — ABNORMAL LOW (ref 26.6–33.0)
MCHC: 30.9 g/dL — ABNORMAL LOW (ref 31.5–35.7)
MCV: 81 fL (ref 79–97)
Monocytes Absolute: 0.5 10*3/uL (ref 0.1–0.9)
Monocytes: 12 %
Neutrophils Absolute: 1.8 10*3/uL (ref 1.4–7.0)
Neutrophils: 44 %
Platelets: 312 10*3/uL (ref 150–450)
RBC: 5.43 x10E6/uL — ABNORMAL HIGH (ref 3.77–5.28)
RDW: 14.8 % (ref 11.7–15.4)
WBC: 4.2 10*3/uL (ref 3.4–10.8)

## 2023-07-25 LAB — CT, NG, MYCOPLASMAS NAA, URINE
Chlamydia trachomatis, NAA: NEGATIVE
Mycoplasma genitalium NAA: NEGATIVE
Mycoplasma hominis NAA: NEGATIVE
Neisseria gonorrhoeae, NAA: NEGATIVE
Ureaplasma spp NAA: NEGATIVE

## 2023-07-25 LAB — HIV ANTIBODY (ROUTINE TESTING W REFLEX): HIV Screen 4th Generation wRfx: NONREACTIVE

## 2023-07-25 LAB — HEMOGLOBIN A1C
Est. average glucose Bld gHb Est-mCnc: 117 mg/dL
Hgb A1c MFr Bld: 5.7 % — ABNORMAL HIGH (ref 4.8–5.6)

## 2023-08-07 ENCOUNTER — Telehealth (HOSPITAL_BASED_OUTPATIENT_CLINIC_OR_DEPARTMENT_OTHER): Payer: Medicaid Other | Admitting: Family Medicine

## 2023-08-07 ENCOUNTER — Encounter (HOSPITAL_BASED_OUTPATIENT_CLINIC_OR_DEPARTMENT_OTHER): Payer: Self-pay | Admitting: Family Medicine

## 2023-08-07 DIAGNOSIS — F32A Depression, unspecified: Secondary | ICD-10-CM

## 2023-08-07 DIAGNOSIS — R7303 Prediabetes: Secondary | ICD-10-CM | POA: Insufficient documentation

## 2023-08-07 DIAGNOSIS — F419 Anxiety disorder, unspecified: Secondary | ICD-10-CM

## 2023-08-07 NOTE — Progress Notes (Signed)
   Virtual Visit   I connected with  Elene Arnesen  on 08/07/23 by telehealth and verified that I am speaking with the correct person using two identifiers. Visit completed via video.   I discussed the limitations, risks, security and privacy concerns of performing an evaluation and management service by telephone, including the higher likelihood of inaccurate diagnosis and treatment, and the availability of in person appointments.  We also discussed the likely need of an additional face to face encounter for complete and high quality delivery of care.  I also discussed with the patient that there may be a patient responsible charge related to this service. The patient expressed understanding and wishes to proceed.  Provider location is in medical facility. Patient location is at their home, different from provider location. People involved in care of the patient during this telehealth encounter were myself, my nurse/medical assistant, and my front office/scheduling team member.  Review of Systems: No fevers, chills, night sweats, weight loss, chest pain, or shortness of breath.   Objective Findings:    General: Speaking full sentences, no audible heavy breathing.  Sounds alert and appropriately interactive.    Independent interpretation of tests performed by another provider:   None.  Brief History, Exam, Impression, and Recommendations:    Anxiety and depression Patient reports that she has been doing great with initiation of Lexapro.  She has noticed mild headache which has been occurring with Lexapro, will occur for about 1 hour after taking medication and then subside on its own.  Otherwise, has been tolerating medication without issue.  She is very pleased with results thus far. We did discuss general considerations related to medication as well as associated headache with taking medication.  For now, patient would prefer to continue with medication given benefits that she has seen.   She will look to switch to nighttime administration to see if this can help with symptoms.  If continuing to have difficulty with headache related to medication, can consider switching to alternative SSRI, would likely switch to low-dose of sertraline if making change.  She will send [place] a message if she decides that she would like to proceed with this change, or we will follow-up on progress at next appointment about 1 month  I discussed the above assessment and treatment plan with the patient. The patient was provided an opportunity to ask questions and all were answered. The patient agreed with the plan and demonstrated an understanding of the instructions.   The patient was advised to call back or seek an in-person evaluation if the symptoms worsen or if the condition fails to improve as anticipated.   I provided 12 minutes of face to face and non-face-to-face time during this encounter date, time was needed to gather information, review chart, records, communicate/coordinate with staff remotely, as well as complete documentation.   ___________________________________________ Greta Yung de [place], MD, ABFM, [facility] Primary Care and Sports Medicine [facility]

## 2023-08-07 NOTE — Assessment & Plan Note (Signed)
Patient reports that she has been doing great with initiation of Lexapro.  She has noticed mild headache which has been occurring with Lexapro, will occur for about 1 hour after taking medication and then subside on its own.  Otherwise, has been tolerating medication without issue.  She is very pleased with results thus far. We did discuss general considerations related to medication as well as associated headache with taking medication.  For now, patient would prefer to continue with medication given benefits that she has seen.  She will look to switch to nighttime administration to see if this can help with symptoms.  If continuing to have difficulty with headache related to medication, can consider switching to alternative SSRI, would likely switch to low-dose of sertraline if making change.  She will send [place] a message if she decides that she would like to proceed with this change, or we will follow-up on progress at next appointment about 1 month

## 2023-08-14 ENCOUNTER — Other Ambulatory Visit (HOSPITAL_BASED_OUTPATIENT_CLINIC_OR_DEPARTMENT_OTHER): Payer: Self-pay | Admitting: Family Medicine

## 2023-08-21 ENCOUNTER — Telehealth: Payer: Medicaid Other | Admitting: Nurse Practitioner

## 2023-08-21 DIAGNOSIS — B370 Candidal stomatitis: Secondary | ICD-10-CM

## 2023-08-21 MED ORDER — NYSTATIN 100000 UNIT/ML MT SUSP: 5.0000 mL | Freq: Four times a day (QID) | OROMUCOSAL | 0 refills | Status: AC
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
## 2023-09-03 ENCOUNTER — Encounter (HOSPITAL_BASED_OUTPATIENT_CLINIC_OR_DEPARTMENT_OTHER): Payer: Medicaid Other | Admitting: Family Medicine

## 2023-09-21 ENCOUNTER — Telehealth: Payer: Medicaid Other | Admitting: Family Medicine

## 2023-09-21 DIAGNOSIS — L0501 Pilonidal cyst with abscess: Secondary | ICD-10-CM | POA: Diagnosis not present

## 2023-09-21 DIAGNOSIS — K611 Rectal abscess: Secondary | ICD-10-CM | POA: Diagnosis not present

## 2023-09-21 NOTE — Progress Notes (Signed)
 Pt did not show for visit [facility]

## 2023-09-22 ENCOUNTER — Telehealth: Payer: Medicaid Other

## 2023-10-01 ENCOUNTER — Ambulatory Visit: Payer: Medicaid Other | Admitting: Dietician

## 2023-10-03 ENCOUNTER — Encounter (HOSPITAL_BASED_OUTPATIENT_CLINIC_OR_DEPARTMENT_OTHER): Payer: Medicaid Other | Admitting: Family Medicine

## 2023-10-04 DIAGNOSIS — R059 Cough, unspecified: Secondary | ICD-10-CM | POA: Diagnosis not present

## 2023-10-04 DIAGNOSIS — U071 COVID-19: Secondary | ICD-10-CM | POA: Diagnosis not present

## 2023-10-04 DIAGNOSIS — L0501 Pilonidal cyst with abscess: Secondary | ICD-10-CM | POA: Diagnosis not present

## 2023-10-05 ENCOUNTER — Telehealth: Payer: Medicaid Other | Admitting: Emergency Medicine

## 2023-10-05 DIAGNOSIS — U071 COVID-19: Secondary | ICD-10-CM | POA: Diagnosis not present

## 2023-10-05 MED ORDER — IBUPROFEN 800 MG PO TABS: 800.0000 mg | ORAL_TABLET | Freq: Three times a day (TID) | ORAL | 0 refills | Status: AC

## 2023-10-05 NOTE — Patient Instructions (Signed)
  Jeris Lusher, thank you for joining Roxy Horseman, PA-C for today's virtual visit.  While this provider is not your primary care provider (PCP), if your PCP is located in our provider database this encounter information will be shared with them immediately following your visit.   A [facility] MyChart account gives you access to today's visit and all your visits, tests, and labs performed at [facility] " click here if you don't have a [facility] MyChart account or go to [email or web address]  Consent: (Patient) Erica Randall provided verbal consent for this virtual visit at the beginning of the encounter.  Current Medications:  Current Outpatient Medications:    ibuprofen (ADVIL) 800 MG tablet, Take 1 tablet (800 mg total) by mouth 3 (three) times daily., Disp: 21 tablet, Rfl: 0   escitalopram (LEXAPRO) 5 MG tablet, TAKE 1 TABLET (5 MG TOTAL) BY MOUTH DAILY., Disp: 90 tablet, Rfl: 1   nystatin (MYCOSTATIN) 100000 UNIT/ML suspension, Take 5 mLs (500,000 Units total) by mouth 4 (four) times daily., Disp: 473 mL, Rfl: 0   Medications ordered in this encounter:  Meds ordered this encounter  Medications   ibuprofen (ADVIL) 800 MG tablet    Sig: Take 1 tablet (800 mg total) by mouth 3 (three) times daily.    Dispense:  21 tablet    Refill:  0    Supervising Provider:   Merrilee Jansky [[id number]]     *If you need refills on other medications prior to your next appointment, please contact your pharmacy*  Follow-Up: Call back or seek an in-person evaluation if the symptoms worsen or if the condition fails to improve as anticipated.  [facility] [phone]  Other Instructions    If you have been instructed to have an in-person evaluation today at a local Urgent Care facility, please use the link below. It will take you to a list of all of our available [facility], including address, phone number and hours of operation. Please do not  delay care.  [facility]  If you or a family member do not have a primary care provider, use the link below to schedule a visit and establish care. When you choose a [facility] primary care physician or advanced practice provider, you gain a long-term partner in health. Find a Primary Care Provider  Learn more about [facility]'s in-office and virtual care options:  - Get Care Now

## 2023-10-05 NOTE — Progress Notes (Signed)
Virtual Visit Consent   Erica Randall, you are scheduled for a virtual visit with a [facility] provider today. Just as with appointments in the office, your consent must be obtained to participate. Your consent will be active for this visit and any virtual visit you may have with one of our providers in the next 365 days. If you have a MyChart account, a copy of this consent can be sent to you electronically.  As this is a virtual visit, video technology does not allow for your provider to perform a traditional examination. This may limit your provider's ability to fully assess your condition. If your provider identifies any concerns that need to be evaluated in person or the need to arrange testing (such as labs, EKG, etc.), we will make arrangements to do so. Although advances in technology are sophisticated, we cannot ensure that it will always work on either your end or our end. If the connection with a video visit is poor, the visit may have to be switched to a telephone visit. With either a video or telephone visit, we are not always able to ensure that we have a secure connection.  By engaging in this virtual visit, you consent to the provision of healthcare and authorize for your insurance to be billed (if applicable) for the services provided during this visit. Depending on your insurance coverage, you may receive a charge related to this service.  I need to obtain your verbal consent now. Are you willing to proceed with your visit today? Erica Bonfanti has provided verbal consent on 10/05/2023 for a virtual visit (video or telephone). Roxy Horseman, PA-C  Date: 10/05/2023 11:44 AM  Virtual Visit via Video Note   I, Roxy Horseman, connected with  Erica Randall  ([id number], [birth year]) on 10/05/23 at 11:45 AM EST by a video-enabled telemedicine application and verified that I am speaking with the correct person using two identifiers.  Location: Patient: Virtual Visit Location Patient:  Home Provider: Virtual Visit Location Provider: Home Office   I discussed the limitations of evaluation and management by telemedicine and the availability of in person appointments. The patient expressed understanding and agreed to proceed.    History of Present Illness: Erica Randall is a 23 y.o. who identifies as a female who was assigned female at birth, and is being seen today for COVID.  States that she was diagnosed yesterday.  She states that she is still having severe bodyaches and headaches.  She has been taking some cough medicine.  She states that her symptoms are keep her from resting well.  She denies any other complaints.  Denies pregnancy or breast feeding.  She requests ibuprofen 800mg  to help with her symptoms.  HPI: HPI  Problems:  Patient Active Problem List   Diagnosis Date Noted   Prediabetes 08/07/2023   Obesity, Class II, BMI 35-39.9 07/23/2023   Anxiety and depression 07/23/2023   Routine screening for STI (sexually transmitted infection) 07/23/2023    Allergies: No Known Allergies Medications:  Current Outpatient Medications:    ibuprofen (ADVIL) 800 MG tablet, Take 1 tablet (800 mg total) by mouth 3 (three) times daily., Disp: 21 tablet, Rfl: 0   escitalopram (LEXAPRO) 5 MG tablet, TAKE 1 TABLET (5 MG TOTAL) BY MOUTH DAILY., Disp: 90 tablet, Rfl: 1   nystatin (MYCOSTATIN) 100000 UNIT/ML suspension, Take 5 mLs (500,000 Units total) by mouth 4 (four) times daily., Disp: 473 mL, Rfl: 0  Observations/Objective: Patient is well-developed, well-nourished in no acute distress.  Resting  comfortably at home.  Head is normocephalic, atraumatic.  No labored breathing.  Speech is clear and coherent with logical content.  Patient is alert and oriented at baseline.    Assessment and Plan: 1. COVID-19 (Primary)  Patient complaints of body aches and headache associated with COVID and requests rx for ibuprofen 800mg .  Discussed additional supportive care options.   She  also asked about something to help her sleep.  OTC melatonin recommended.  Meds ordered this encounter  Medications   ibuprofen (ADVIL) 800 MG tablet    Sig: Take 1 tablet (800 mg total) by mouth 3 (three) times daily.    Dispense:  21 tablet    Refill:  0    Supervising Provider:   Merrilee Jansky [[id number]]     Follow Up Instructions: I discussed the assessment and treatment plan with the patient. The patient was provided an opportunity to ask questions and all were answered. The patient agreed with the plan and demonstrated an understanding of the instructions.  A copy of instructions were sent to the patient via MyChart unless otherwise noted below.     The patient was advised to call back or seek an in-person evaluation if the symptoms worsen or if the condition fails to improve as anticipated.    Roxy Horseman, PA-C

## 2023-10-25 DIAGNOSIS — A599 Trichomoniasis, unspecified: Secondary | ICD-10-CM | POA: Diagnosis not present

## 2023-10-25 DIAGNOSIS — A64 Unspecified sexually transmitted disease: Secondary | ICD-10-CM | POA: Diagnosis not present

## 2023-11-06 ENCOUNTER — Telehealth: Payer: Self-pay | Admitting: Family Medicine

## 2023-11-06 ENCOUNTER — Encounter (HOSPITAL_BASED_OUTPATIENT_CLINIC_OR_DEPARTMENT_OTHER): Payer: Medicaid Other | Admitting: Family Medicine

## 2023-11-06 NOTE — Telephone Encounter (Signed)
 Copied from CRM [id number]. Topic: Appointments - Scheduling Inquiry for Clinic >> Nov 06, 2023  9:20 AM Dennison Nancy wrote: Reason for CRM: sameday cancellation 11/06/23  called at 9:18 the appointment is at 10:30 am

## 2023-11-12 ENCOUNTER — Encounter (HOSPITAL_BASED_OUTPATIENT_CLINIC_OR_DEPARTMENT_OTHER): Payer: Self-pay | Admitting: *Deleted

## 2023-11-12 NOTE — Telephone Encounter (Signed)
 Mychart msg sent to patient to reschedule missed appt

## 2024-02-06 ENCOUNTER — Ambulatory Visit: Admitting: Obstetrics & Gynecology

## 2024-02-07 ENCOUNTER — Encounter

## 2024-02-07 DIAGNOSIS — Z113 Encounter for screening for infections with a predominantly sexual mode of transmission: Secondary | ICD-10-CM | POA: Diagnosis not present

## 2024-02-07 DIAGNOSIS — Z3202 Encounter for pregnancy test, result negative: Secondary | ICD-10-CM | POA: Diagnosis not present

## 2024-02-07 DIAGNOSIS — Z202 Contact with and (suspected) exposure to infections with a predominantly sexual mode of transmission: Secondary | ICD-10-CM | POA: Diagnosis not present

## 2024-02-13 ENCOUNTER — Ambulatory Visit: Admitting: Obstetrics & Gynecology

## 2024-02-22 ENCOUNTER — Telehealth: Admitting: Family Medicine

## 2024-02-22 DIAGNOSIS — Z3009 Encounter for other general counseling and advice on contraception: Secondary | ICD-10-CM

## 2024-02-22 DIAGNOSIS — Z309 Encounter for contraceptive management, unspecified: Secondary | ICD-10-CM | POA: Diagnosis not present

## 2024-02-22 MED ORDER — NORGESTIMATE-ETH ESTRADIOL 0.25-35 MG-MCG PO TABS: 1.0000 | ORAL_TABLET | Freq: Every day | ORAL | 11 refills | Status: AC

## 2024-02-22 NOTE — Patient Instructions (Signed)
 Birth Control Pills (Oral Contraceptives): What to Know Oral contraceptive pills, or birth control pills, are medicines that prevent pregnancy. They work by: Preventing the ovaries from releasing eggs. Thickening mucus in the lower part of the uterus called the cervix. This prevents sperm from getting in the uterus. Thinning the lining of the uterus. This prevents a fertilized egg from attaching to the lining. Birth control pills are highly effective at preventing pregnancy when you take them exactly as told. Birth control pills do not prevent sexually transmitted infections (STIs). Use condoms while taking birth control pills to help prevent STIs. What happens before starting birth control pills? Before you start taking birth control pills: You may have a physical exam, blood test, and Pap test. Your health care provider will make sure it's OK for you to use birth control pills. Birth control pills are not a good option for: People who smoke and are older than age 3. People who have or have had certain conditions, such as: High blood pressure. Deep vein thrombosis. Blood clots in the lungs. Stroke. Heart disease or recent heart attack. Peripheral vascular disease. Blood clotting disorder or history of blood clots. Certain cancers. Diabetes. Gallbladder disease. High cholesterol. Kidney disease. Liver disease. Migraine headaches. Systemic lupus erythematosus (SLE). Unusual vaginal bleeding. Ask your provider about the possible side effects of the birth control pills. It can take 2-3 months for your body to adjust to changes in hormone levels. Types of birth control pills  Birth control pills have the hormones estrogen and progestin in them, or progestin only. The combination pill This type of pill contains estrogen and progestin hormones. These pills come in packs of 21 or 28 pills. Some packs with 28-day pills contain estrogen and progestin for the first 21-24 days. Hormone-free  pills, called inactive pills, are taken for the final 4-7 days. You should have menstrual bleeding during the time you take the inactive pills. In packs with 21 pills, you take no pills for the remaining 7 days in a 28-day period of time. Menstrual bleeding happens during these days. Some people prefer taking a pill for 28 days to help create a routine. Extended-interval contraception pills come in packs of 91 pills. The first 84 pills have both estrogen and progestin. The last 7 pills are inactive pills. Menstrual bleeding happens during the inactive pill days. With this schedule, menstrual bleeding happens once every 3 months. Continuous contraception pills come in packs of 28 pills. All pills in the pack contain estrogen and progestin. With this schedule, regular menstrual bleeding does not happen. But there may be spotting or irregular bleeding. Progestin-only pills This type of pill is often called the mini-pill and contains the progestin hormone only. It comes in packs of 28 pills. In some packs, the last 4 pills are inactive pills. You need to take this pill at the same time every day to prevent pregnancy. Menstrual bleeding may not be regular or predictable. What are the advantages? Birth control provides reliable and continuous contraception if taken as told. It may treat or decrease symptoms of: Menstrual period cramps, heavy menstrual flow, or bleeding from the uterus that's not normal. Irregular menstrual cycle or bleeding. Acne, depending on the type of pill. Polycystic ovarian syndrome (PCOS). Endometriosis. Iron deficiency anemia. Premenstrual symptoms, including very bad depression, anxiety, or getting annoyed very easily. It may also: Reduce the risk of endometrial and ovarian cancer. Be used as emergency contraception. Prevent ectopic pregnancies and infections of the fallopian tubes. What can  make birth control pills less effective? Birth control pills may be less effective  if: You forget to take the pill every day. Birth control pills may not work as well if you miss more than one pill. You may need to use a back-up contraceptive. For progestin-only pills, it's especially important to take the pill at the same time each day. Even taking it 3 hours late can increase the risk of pregnancy. You have a disease of the stomach or intestines that makes your body less able to absorb the pill. You take birth control pills with other medicines that make them less effective, such as antibiotics, certain HIV medicines, and some seizure medicines. You take expired pills. You forget to restart the pill after 7 days of not taking it. This applies to the packs of 21 pills and extended-interval packs of 91 pills. What are the side effects and risks? Birth control pills can sometimes cause side effects, such as: Headache. Depression. Trouble sleeping. Nausea and vomiting, bloating, or fluid retention. Breast tenderness. Irregular bleeding or spotting during the first several months. Increase in blood pressure. Gallbladder problems. Liver injury. Unusual vaginal discharge, itching, or smell. Sun sensitivity. Birth control pills with estrogen and progestin may slightly increase the risk of: Blood clots. Heart attack. Stroke. Follow these instructions at home: Follow instructions from your provider about how to start taking your first cycle of birth control pills. Depending on when you start the pill, you may need to use a backup form of birth control, such as condoms, during the first week. Make sure you know what steps to take if you forget to take a pill. If you think you're pregnant, stop taking birth control pills right away. Contact a health care provider if: If you think you're pregnant. This information is not intended to replace advice given to you by your health care provider. Make sure you discuss any questions you have with your health care provider. Document  Revised: 03/18/2023 Document Reviewed: 03/18/2023 Elsevier Patient Education  2024 [employer].

## 2024-02-22 NOTE — Progress Notes (Signed)
 Virtual Visit Consent   Erica Randall, you are scheduled for a virtual visit with a [facility] provider today. Just as with appointments in the office, your consent must be obtained to participate. Your consent will be active for this visit and any virtual visit you may have with one of our providers in the next 365 days. If you have a MyChart account, a copy of this consent can be sent to you electronically.  As this is a virtual visit, video technology does not allow for your provider to perform a traditional examination. This may limit your provider's ability to fully assess your condition. If your provider identifies any concerns that need to be evaluated in person or the need to arrange testing (such as labs, EKG, etc.), we will make arrangements to do so. Although advances in technology are sophisticated, we cannot ensure that it will always work on either your end or our end. If the connection with a video visit is poor, the visit may have to be switched to a telephone visit. With either a video or telephone visit, we are not always able to ensure that we have a secure connection.  By engaging in this virtual visit, you consent to the provision of healthcare and authorize for your insurance to be billed (if applicable) for the services provided during this visit. Depending on your insurance coverage, you may receive a charge related to this service.  I need to obtain your verbal consent now. Are you willing to proceed with your visit today? Erica Randall has provided verbal consent on 02/22/2024 for a virtual visit (video or telephone). Erica Huger, FNP  Date: 02/22/2024 4:48 PM   Virtual Visit via Video Note   I, Erica Randall, connected with  Erica Randall  ([id number], 2001/07/28) on 02/22/24 at  4:45 PM EDT by a video-enabled telemedicine application and verified that I am speaking with the correct person using two identifiers.  Location: Patient: Virtual Visit Location Patient:  Home Provider: Virtual Visit Location Provider: Home Office   I discussed the limitations of evaluation and management by telemedicine and the availability of in person appointments. The patient expressed understanding and agreed to proceed.    History of Present Illness: Erica Randall is a 23 y.o. who identifies as a female who was assigned female at birth, and is being seen today for refill of ortho-cyclen. She has ran out from her GYN. She says she has not missed pills yet or a period. Denies pregnancy. Aaron Aas  HPI: HPI  Problems:  Patient Active Problem List   Diagnosis Date Noted   Prediabetes 08/07/2023   Obesity, Class II, BMI 35-39.9 07/23/2023   Anxiety and depression 07/23/2023   Routine screening for STI (sexually transmitted infection) 07/23/2023    Allergies: No Known Allergies Medications:  Current Outpatient Medications:    escitalopram  (LEXAPRO ) 5 MG tablet, TAKE 1 TABLET (5 MG TOTAL) BY MOUTH DAILY., Disp: 90 tablet, Rfl: 1   ibuprofen  (ADVIL ) 800 MG tablet, Take 1 tablet (800 mg total) by mouth 3 (three) times daily., Disp: 21 tablet, Rfl: 0   nystatin  (MYCOSTATIN ) 100000 UNIT/ML suspension, Take 5 mLs (500,000 Units total) by mouth 4 (four) times daily., Disp: 473 mL, Rfl: 0  Observations/Objective: Patient is well-developed, well-nourished in no acute distress.  Resting comfortably  at home.  Head is normocephalic, atraumatic.  No labored breathing.  Speech is clear and coherent with logical content.  Patient is alert and oriented at baseline.    Assessment and Plan:  There are no diagnoses linked to this encounter. Follow up with gyn as planned.   Follow Up Instructions: I discussed the assessment and treatment plan with the patient. The patient was provided an opportunity to ask questions and all were answered. The patient agreed with the plan and demonstrated an understanding of the instructions.  A copy of instructions were sent to the patient via MyChart  unless otherwise noted below.     The patient was advised to call back or seek an in-person evaluation if the symptoms worsen or if the condition fails to improve as anticipated.    Gary Bultman, FNP
